# Patient Record
Sex: Male | Born: 2000 | Race: Black or African American | Hispanic: No | Marital: Single | Smoking: Never smoker
Health system: Southern US, Community
[De-identification: ages and names within clinical notes are randomized; demographics above are authoritative.]

## PROBLEM LIST (undated history)

## (undated) ENCOUNTER — Emergency Department (HOSPITAL_COMMUNITY): Admission: EM | Payer: Medicaid Other | Source: Home / Self Care

## (undated) DIAGNOSIS — F319 Bipolar disorder, unspecified: Secondary | ICD-10-CM

## (undated) DIAGNOSIS — R011 Cardiac murmur, unspecified: Secondary | ICD-10-CM

## (undated) DIAGNOSIS — F909 Attention-deficit hyperactivity disorder, unspecified type: Secondary | ICD-10-CM

## (undated) HISTORY — PX: NO PAST SURGERIES: SHX2092

---
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
## 2018-01-14 ENCOUNTER — Emergency Department (HOSPITAL_COMMUNITY): Payer: Medicaid Other

## 2018-01-14 ENCOUNTER — Emergency Department (HOSPITAL_COMMUNITY)
Admission: EM | Admit: 2018-01-14 | Discharge: 2018-01-14 | Disposition: A | Discharge status: Home / Self Care | Payer: Medicaid Other | Attending: Emergency Medicine | Admitting: Emergency Medicine

## 2018-01-14 ENCOUNTER — Encounter (HOSPITAL_COMMUNITY): Payer: Self-pay | Admitting: Emergency Medicine

## 2018-01-14 DIAGNOSIS — S43102A Unspecified dislocation of left acromioclavicular joint, initial encounter: Secondary | ICD-10-CM | POA: Diagnosis not present

## 2018-01-14 DIAGNOSIS — X500XXA Overexertion from strenuous movement or load, initial encounter: Secondary | ICD-10-CM | POA: Diagnosis not present

## 2018-01-14 DIAGNOSIS — S4992XA Unspecified injury of left shoulder and upper arm, initial encounter: Secondary | ICD-10-CM | POA: Diagnosis present

## 2018-01-14 DIAGNOSIS — Y929 Unspecified place or not applicable: Secondary | ICD-10-CM | POA: Diagnosis not present

## 2018-01-14 DIAGNOSIS — Y9389 Activity, other specified: Secondary | ICD-10-CM | POA: Diagnosis not present

## 2018-01-14 DIAGNOSIS — Y999 Unspecified external cause status: Secondary | ICD-10-CM | POA: Insufficient documentation

## 2018-01-14 MED ORDER — OXYCODONE-ACETAMINOPHEN 5-325 MG PO TABS: 1.0000 | ORAL_TABLET | Freq: Four times a day (QID) | ORAL | 0 refills | Status: DC | PRN

## 2018-01-14 MED ORDER — FENTANYL CITRATE (PF) 100 MCG/2ML IJ SOLN
50.0000 ug | Freq: Once | INTRAMUSCULAR | Status: AC
Start: 2018-01-14 — End: 2018-01-14
  Administered 2018-01-14: 50 ug via INTRAVENOUS
  Filled 2018-01-14: qty 2

## 2018-01-14 MED ORDER — IBUPROFEN 800 MG PO TABS: 800.0000 mg | ORAL_TABLET | Freq: Three times a day (TID) | ORAL | 0 refills | Status: DC

## 2018-01-14 NOTE — ED Provider Notes (Signed)
Emergency Department Provider Note   I have reviewed the triage vital signs and the nursing notes.   HISTORY  Chief Complaint Shoulder Injury   HPI Stephen Byrd is a 17 y.o. male with a history of left shoulder spontaneous dislocation and reductions also AC separation the presents the emergency department today with left shoulder deformity.  Patient states he was reaching back to grab a box when he felt a pop in his left shoulder and had instant deformity and pain and decreased range of motion without left shoulder.  Similar to previous episodes but it did not really return to normal like it normally does.  No falls.  No other associated symptoms.  No pain elsewhere. No other associated or modifying symptoms.    Past Medical History:  Diagnosis Date  . ADHD (attention deficit hyperactivity disorder)   . Bipolar 1 disorder (HCC)   . Heart murmur     There are no active problems to display for this patient.   History reviewed. No pertinent surgical history.  Current Outpatient Rx  . Order #: [id number] Class: Print  . Order #: [id number] Class: Print    Allergies Patient has no known allergies.  History reviewed. No pertinent family history.  Social History Social History   Tobacco Use  . Smoking status: Never Smoker  . Smokeless tobacco: Never Used  Substance Use Topics  . Alcohol use: No  . Drug use: Yes    Frequency: 2.0 times per week    Types: Marijuana    Review of Systems  All other systems negative except as documented in the HPI. All pertinent positives and negatives as reviewed in the HPI. ____________________________________________   PHYSICAL EXAM:  VITAL SIGNS: ED Triage Vitals [01/14/18 1551]  Enc Vitals Group     BP 135/66     Pulse Rate 74     Resp 16     Temp 98.7 F (37.1 C)     Temp Source Oral     SpO2 100 %     Weight 150 lb (68 kg)     Height 5\' 6"  (1.676 m)    Constitutional: Alert and oriented. Well appearing and in no  acute distress. Eyes: Conjunctivae are normal. PERRL. EOMI. Head: Atraumatic. Nose: No congestion/rhinnorhea. Mouth/Throat: Mucous membranes are moist.  Oropharynx non-erythematous. Neck: No stridor.  No meningeal signs.   Cardiovascular: Normal rate, regular rhythm. Good peripheral circulation. Grossly normal heart sounds.   Respiratory: Normal respiratory effort.  No retractions. Lungs CTAB. Gastrointestinal: Soft and nontender. No distention.  Musculoskeletal: No lower extremity tenderness nor edema. No gross deformities of extremities. Left shoulder with obvious protuberance in area of AC joint. Neurologic:  Normal speech and language. No gross focal neurologic deficits are appreciated.  Skin:  Skin is warm, dry and intact. No rash noted.  ____________________________________________  RADIOLOGY  Dg Shoulder Left  Result Date: 01/14/2018 CLINICAL DATA:  Patient with left shoulder pain. EXAM: LEFT SHOULDER - 2+ VIEW COMPARISON:  Left shoulder radiograph 07/03/2017 FINDINGS: No acute fracture or dislocation of the glenohumeral joint. Widened appearance of the [facility] with elevation of the distal clavicle. Left hemithorax unremarkable. IMPRESSION: Findings suggestive of AC joint separation. No acute abnormality of the glenohumeral joint. Electronically Signed   By: Annia Beltrew  Davis M.D.   On: 01/14/2018 16:45    ____________________________________________   INITIAL IMPRESSION / ASSESSMENT AND PLAN / ED COURSE  Suspect total AC separation rather than humeral head dislocation. Will xr to confirm. Pain meds.  X-ray consistent with likely type III AC separation.  Put in sling.  We will follow-up with orthopedics this time.  Short course of pain medications given.  Work note to not work for at least 2 weeks or until cleared by orthopedics.   Pertinent labs & imaging results that were available during my care of the patient were reviewed by me and considered in my medical decision making (see  chart for details).  ____________________________________________  FINAL CLINICAL IMPRESSION(S) / ED DIAGNOSES  Final diagnoses:  Separation of left acromioclavicular joint, initial encounter     MEDICATIONS GIVEN DURING THIS VISIT:  Medications  fentaNYL (SUBLIMAZE) injection 50 mcg (50 mcg Intravenous Given 01/14/18 1604)     NEW OUTPATIENT MEDICATIONS STARTED DURING THIS VISIT:  Discharge Medication List as of 01/14/2018  4:50 PM    START taking these medications   Details  ibuprofen (ADVIL,MOTRIN) 800 MG tablet Take 1 tablet (800 mg total) by mouth 3 (three) times daily., Starting Sun 01/14/2018, Print    oxyCODONE-acetaminophen (PERCOCET) 5-325 MG tablet Take 1 tablet by mouth every 6 (six) hours as needed for severe pain., Starting Sun 01/14/2018, Print        Note:  This note was prepared with assistance of Dragon voice recognition software. Occasional wrong-word or sound-a-like substitutions may have occurred due to the inherent limitations of voice recognition software.   Marily Memos, MD 01/14/18 2046

## 2018-01-14 NOTE — ED Triage Notes (Signed)
Pt reports left shoulder dislocation while moving boxes at work.  Deformity to shoulder noted.  3rd time dislocated.

## 2018-01-15 ENCOUNTER — Telehealth: Payer: Self-pay | Admitting: Orthopedic Surgery

## 2018-01-15 NOTE — Telephone Encounter (Signed)
Patient/mom called - aware must speak with patient as 17 years of age, regarding Emergency [facility] visit [facility] for left [facility] injury (?separation) - states thinks has [employer] insurance; appointment pending, as if Medicaid, primary care referral needed per insurance requirement.  Patient's ph# [phone]

## 2018-01-16 NOTE — Telephone Encounter (Signed)
Referral received; called patient to schedule appointment.

## 2018-01-16 NOTE — Telephone Encounter (Signed)
Patient came by office; discussed pending appointment - patient and mom aware that referral is required per insurance. Contacting [facility] to request appointment for as soon as possible based on Emergency room reports. We have also sent faxed request.

## 2018-01-17 ENCOUNTER — Encounter: Payer: Self-pay | Admitting: Orthopedic Surgery

## 2018-01-17 ENCOUNTER — Ambulatory Visit (INDEPENDENT_AMBULATORY_CARE_PROVIDER_SITE_OTHER): Payer: Medicaid Other | Admitting: Orthopedic Surgery

## 2018-01-17 VITALS — BP 107/63 | HR 44 | Ht 67.0 in | Wt 152.0 lb

## 2018-01-17 DIAGNOSIS — S43102A Unspecified dislocation of left acromioclavicular joint, initial encounter: Secondary | ICD-10-CM | POA: Diagnosis not present

## 2018-01-17 MED ORDER — HYDROCODONE-ACETAMINOPHEN 5-325 MG PO TABS: 1.0000 | ORAL_TABLET | Freq: Four times a day (QID) | ORAL | 0 refills | Status: DC | PRN

## 2018-01-17 NOTE — Progress Notes (Signed)
NEW PATIENT OFFICE VISI  Chief Complaint  Patient presents with  . Shoulder Injury    Left shoulder multiple injuries over past year    17 year old male injured his left shoulder traumatic injury 1 year ago sustained an AC joint injury.  He was treated with a sling after ER visit at [facility] in [place].  He says after that injury or during that injury he developed prominence of his distal clavicle.  He subsequently recovered although he always noticed that he could get his arm in certain positions.  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  He took some oxycodone from the hospital which did help his pain and he describes a dull ache but no significant fatigue noticed over the last year.   Review of Systems  Constitutional: Negative for fever.  Musculoskeletal: Negative for neck pain.  Skin: Negative.   Neurological: Negative for tingling and weakness.     Past Medical History:  Diagnosis Date  . ADHD (attention deficit hyperactivity disorder)   . Bipolar 1 disorder (HCC)   . Heart murmur     No past surgical history on file.  Family History  Problem Relation Age of Onset  . Hypertension Mother   . Hypertension Maternal Grandfather    Social History   Tobacco Use  . Smoking status: Never Smoker  . Smokeless tobacco: Never Used  Substance Use Topics  . Alcohol use: No  . Drug use: Yes    Frequency: 2.0 times per week    Types: Marijuana    No Known Allergies  Current Meds  Medication Sig  . ibuprofen (ADVIL,MOTRIN) 800 MG tablet Take 1 tablet (800 mg total) by mouth 3 (three) times daily.  Marland Kitchen. oxyCODONE-acetaminophen (PERCOCET) 5-325 MG tablet Take 1 tablet by mouth every 6 (six) hours as needed for severe pain.    BP 107/63   Pulse (!) 44    Ht 5\' 7"  (1.702 m)   Wt 152 lb (68.9 kg)   BMI 23.81 kg/m   Physical Exam  Constitutional: He is oriented to person, place, and time. He appears well-developed and well-nourished.  Vital signs have been reviewed and are stable. Gen. appearance the patient is well-developed and well-nourished with normal grooming and hygiene.   Neurological: He is alert and oriented to person, place, and time.  Skin: Skin is warm and dry. No erythema.  Psychiatric: He has a normal mood and affect.  Vitals reviewed.   Right Shoulder Exam  Right shoulder exam is normal.  Tenderness  The patient is experiencing no tenderness.  Range of Motion  The patient has normal right shoulder ROM.  Muscle Strength  The patient has normal right shoulder strength.  Tests  Apprehension: negative  Other  Erythema: absent Sensation: normal Pulse: present   Left Shoulder Exam   Tests  Apprehension: negative  Other  Erythema: absent Sensation: normal Pulse: present   Comments:  Most notable is the tenderness and prominence of the left distal clavicle.  Passive range of motion is normal up to 90 degrees flexion and abduction and then after that he has pain at the [facility] worsened with a cross chest compression test.  With the arm at his side is internal/external rotation is normal and cuff strength is  normal in this position he has weakness in abduction and flexion the abduction external rotation test showed a stable glenohumeral joint the skin was intact the pulses were good the lymph nodes were normal and he had normal sensation in his left hand        MEDICAL DECISION SECTION  Xrays were done at [facility].  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  . 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  Any further prescriptions can be ibuprofen until he has surgery if he does have surgery  Meds ordered this encounter  Medications  . HYDROcodone-acetaminophen (NORCO/VICODIN) 5-325 MG tablet    Sig: Take 1 tablet by mouth every 6 (six) hours as needed for moderate pain.    Dispense:  30 tablet    Refill:  0    Fuller Canada, MD  01/17/2018 4:06 PM

## 2018-01-17 NOTE — Patient Instructions (Addendum)
Out of work 12 weeks  Referral to Dr. August Saucer for surgical consultation left shoulder   Acromioclavicular Separation Acromioclavicular separation is an injury to the small joint at the top of the shoulder (acromioclavicular joint or AC joint). The [facility] joint connects the outer tip of the collarbone (clavicle) to the top of the shoulder blade (acromion). Two strong cords of tissue (acromioclavicular ligament and coracoclavicular ligament) stretch across the [facility] joint to keep it in place. An AC joint separation happens when one or both ligaments stretch or tear, causing the joint to separate. There are six types of separation. The type of separation that you have depends on how much the ligaments are damaged and how far the joint has moved out of place. The less severe types of separation are most common. What are the causes? Common causes of this condition include:  A hard, direct hit (blow) to the top of the shoulder.  Falling on the shoulder.  Falling on an outstretched arm.  What increases the risk? This condition is more likely to develop in male athletes under age 43 who participate in sports that involve potential contact, such as:  Football.  Rugby.  Hockey.  Cycling.  Martial arts.  What are the signs or symptoms?  The main symptom of this condition is shoulder pain. Pain may be mild or severe, depending on the type of separation. Other signs and symptoms in the shoulder may include:  Swelling.  Limited range of motion, especially when moving the arm across the body.  Pain and tenderness when touching the top of the shoulder.  Pain when putting weight on the shoulder, such as rolling on the shoulder while sleeping.  A visible bump (deformity) over the joint.  A clicking or popping sound when moving the shoulder.  How is this diagnosed? This condition may be diagnosed based on:  Your symptoms.  Your medical history, including your history of recent injuries.  A  physical exam to check for deformity and limited range of motion.  Imaging tests, such as: ? X-rays. ? MRI. ? Ultrasound.  How is this treated? Treatment for this condition may include:  Resting the shoulder before gradually returning to normal activities.  Icing the shoulder.  NSAIDs to help reduce pain and swelling.  A sling to support your shoulder and keep it from moving.  Physical therapy.  Surgery. This is rare. Surgery may be needed for severe injuries that include breaks (fractures) in a bone, or injuries that do not get better with nonsurgical treatments. Surgery is followed by keeping your joint in place for a period of time (immobilization) and physical therapy.  Follow these instructions at home: If you have a sling:  Wear the sling as told by your health care provider. Remove it only as told by your health care provider.  Reposition the sling if your fingers tingle, become numb, or turn cold and blue.  Do not let your sling get wet if it is not waterproof. Ask your health care provider if you can remove the sling for bathing and showering.  Keep the sling clean. Managing pain, stiffness, and swelling   If directed, apply ice to the injured area. ? Put ice in a plastic bag. ? Place a towel between your skin and the bag. ? Leave the ice on for 20 minutes, 2-3 times a day.  Move your fingers often to avoid stiffness and to lessen swelling. Driving  Do not drive or operate heavy machinery while taking prescription pain  medicine.  Ask your health care provider when it is safe for you to drive. Activity  Rest and return to your normal activities as told by your health care provider. Ask your health care provider what activities are safe for you.  Do exercises as told by your health care provider. General instructions  Do not use any tobacco products, such as cigarettes, chewing tobacco, and e-cigarettes. Tobacco can delay healing. If you need help quitting,  ask your health care provider.  Take over-the-counter and prescription medicines only as told by your health care provider.  Keep all follow-up visits as told by your health care provider. This is important. How is this prevented?  Make sure to use equipment that fits you.  Wear shoulder padding during contact sports.  Be safe and responsible while being active to avoid falls. Contact a health care provider if:  Your pain and stiffness do not improve after 2 weeks. This information is not intended to replace advice given to you by your health care provider. Make sure you discuss any questions you have with your health care provider. Document Released: 04/18/2005 Document Revised: 12/24/2015 Document Reviewed: 03/21/2015 Elsevier Interactive Patient Education  [employer]2018 Elsevier Inc.

## 2018-01-31 ENCOUNTER — Ambulatory Visit (INDEPENDENT_AMBULATORY_CARE_PROVIDER_SITE_OTHER): Payer: Medicaid Other | Admitting: Orthopedic Surgery

## 2018-02-08 ENCOUNTER — Encounter (INDEPENDENT_AMBULATORY_CARE_PROVIDER_SITE_OTHER): Payer: Self-pay | Admitting: Orthopedic Surgery

## 2018-02-08 ENCOUNTER — Ambulatory Visit (INDEPENDENT_AMBULATORY_CARE_PROVIDER_SITE_OTHER): Payer: Medicaid Other

## 2018-02-08 ENCOUNTER — Other Ambulatory Visit (INDEPENDENT_AMBULATORY_CARE_PROVIDER_SITE_OTHER): Payer: Self-pay | Admitting: Orthopedic Surgery

## 2018-02-08 ENCOUNTER — Ambulatory Visit (INDEPENDENT_AMBULATORY_CARE_PROVIDER_SITE_OTHER): Payer: Medicaid Other | Admitting: Orthopedic Surgery

## 2018-02-08 DIAGNOSIS — M25512 Pain in left shoulder: Secondary | ICD-10-CM

## 2018-02-08 DIAGNOSIS — S43102A Unspecified dislocation of left acromioclavicular joint, initial encounter: Secondary | ICD-10-CM

## 2018-02-08 NOTE — Progress Notes (Signed)
Office Visit Note   Patient: Stephen Byrd           Date of Birth: December 28, 2000           MRN: [id number] Visit Date: 02/08/2018 Requested by: Stephen Hearing, MD [address] [place], [place] [id number] PCP: Stephen Byrd., PA-C  Subjective: Chief Complaint  Patient presents with  . Left Shoulder - Pain    HPI: Stephen Byrd is a patient with left shoulder pain.  Injured his shoulder 2 years ago.  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  Takes ibuprofen for pain.  He is right-hand dominant.  At times he reports decreased range of motion.  He injured his shoulder playing with his son 2 years ago while [place] was [place] the park.  No family history of personal history of DVT or pulmonary embolism              ROS: All systems reviewed are negative as they relate to the chief complaint within the history of present illness.  Patient denies  fevers or chills.   Assessment & Plan: Visit Diagnoses:  1. Arthralgia of left acromioclavicular joint   2. AC joint dislocation, left, initial encounter     Plan: Impression is chronic AC separation grade 5 with some limitation of overhead motion in a patient who is currently doing a lot of physical type work.  I discussed operative and nonoperative treatment options with Stephen Byrd.  I think in general he would do better with distal clavicle excision in coracoclavicular ligament stabilization.  Would likely transfer some of that CA ligament into the distal end of that clavicle for added biologic fixation.  Risk and benefits of surgical intervention including but not limited to incomplete pain relief incomplete restoration of normal anatomy as well as difficulty with overhead lifting described.  All questions answered.  Anticipate that he would be out of heavy duty lifting for at least 3 months.  Conceivably he could go back to doing some light duty type work not involving lifting with the right arm after  about a month.  All questions answered  Follow-Up Instructions: No follow-ups on file.   Orders:  Orders Placed This Encounter  Procedures  . XR AC Joints   No orders of the defined types were placed in this encounter.     Procedures: No procedures performed   Clinical Data: No additional findings.  Objective: Vital Signs: There were no vitals taken for this visit.  Physical Exam:   Constitutional: Patient appears well-developed HEENT:  Head: Normocephalic Eyes:EOM are normal Neck: Normal range of motion Cardiovascular: Normal rate Pulmonary/chest: Effort normal Neurologic: Patient is alert Skin: Skin is warm Psychiatric: Patient has normal mood and affect    Ortho Exam: Ortho exam demonstrates intact motor or sensory function to the left arm.  There is significant prominence of the distal end of the clavicle left versus right.  Patient does have tenderness to palpation around that [facility] joint on the left as well as painful and somewhat limited forward flexion and abduction past 90 degrees.  Cuff strength is intact and O'Brien's testing negative on the left.  Right shoulder examination is normal  Specialty Comments:  No specialty comments available.  Imaging: Xr Ac Joints  Result Date: 02/08/2018 Weighted [place]ck view of bilateral AC joints demonstrates increased coracoclavicular distance of 22 mm on the left compared to 11 mm on the right  PMFS History: There are no active problems to display for this patient.  Past Medical History:  Diagnosis Date  . ADHD (attention deficit hyperactivity disorder)   . Bipolar 1 disorder (HCC)   . Heart murmur     Family History  Problem Relation Age of Onset  . Hypertension Mother   . Hypertension Maternal Grandfather     History reviewed. No pertinent surgical history. Social History   Occupational History  . Not on file  Tobacco Use  . Smoking status: Never Smoker  . Smokeless tobacco: Never Used  Substance  and Sexual Activity  . Alcohol use: No  . Drug use: Yes    Frequency: 2.0 times per week    Types: Marijuana  . Sexual activity: Not on file

## 2018-02-12 ENCOUNTER — Encounter (HOSPITAL_COMMUNITY): Payer: Self-pay | Admitting: *Deleted

## 2018-02-12 NOTE — Anesthesia Preprocedure Evaluation (Addendum)
Anesthesia Evaluation  Patient identified by MRN, date of birth, ID band Patient awake    Reviewed: Allergy & Precautions, H&P , NPO status , Patient's Chart, lab work & pertinent test results  Airway Mallampati: II  TM Distance: >3 FB Neck ROM: Full    Dental no notable dental hx. (+) Teeth Intact, Dental Advisory Given   Pulmonary    Pulmonary exam normal breath sounds clear to auscultation       Cardiovascular negative cardio ROS Normal cardiovascular exam Rhythm:Regular Rate:Normal     Neuro/Psych Bipolar Disorder    GI/Hepatic negative GI ROS,   Endo/Other    Renal/GU      Musculoskeletal negative musculoskeletal ROS (+)   Abdominal   Peds  Hematology   Anesthesia Other Findings   Reproductive/Obstetrics                            Anesthesia Physical Anesthesia Plan  ASA: II  Anesthesia Plan: General   Post-op Pain Management:  Regional for Post-op pain   Induction: Intravenous  PONV Risk Score and Plan: 2 and Treatment may vary due to age or medical condition, Ondansetron and Dexamethasone  Airway Management Planned: Oral ETT  Additional Equipment:   Intra-op Plan:   Post-operative Plan: Extubation in OR  Informed Consent: I have reviewed the patients History and Physical, chart, labs and discussed the procedure including the risks, benefits and alternatives for the proposed anesthesia with the patient or authorized representative who has indicated his/her understanding and acceptance.   Dental advisory given  Plan Discussed with: CRNA  Anesthesia Plan Comments:        Anesthesia Quick Evaluation

## 2018-02-12 NOTE — Progress Notes (Signed)
Denies chest pain, shob, or syncopal episodes. Advised that he had not seen cardiology since 2016. Verbalized understanding of instructions. Requested that patient stop marijuana.

## 2018-02-12 NOTE — Progress Notes (Signed)
Anesthesia Chart Review: SAME DAY WORK-UP  Case:  [id number] Date/Time:  02/13/18 1446   Procedure:  LEFT SHOULDER ACROMIOCLAVICULAR RECONSTRUCTION (Left )   Anesthesia type:  General   Pre-op diagnosis:  left shoulder acromioclavicular separation   Location:  [facility] OR ROOM 06 / [facility] OR   Surgeon:  Cammy Copa, MD      DISCUSSION: Patient is a 17 year old male schedule for the above procedure. He had ED visit 07/03/17 for left shoulder pain with AC joint separation.  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   History includes never smoker, Bipolar 1 disorder, ADHD, murmur (trace TR/MR '16). He underwent extensive cardiac testing 2016 for recurrent syncope with work-up showing, "only physiologic adaptation of the heart to athletic conditioning without evidence of significant underlying cardiac pathology, including no evidence of: hypertrophic cardiomyopathy, anomalous origin of the coronary arteries, myocarditis, ARVC, inherited arrhythmia syndrome (long QT, CPVT, Brugada), or WPW. It may be that he has some degree of dysautonomia that has presented in a less typical manner." No activity restrictions recommended--see below for more details and testing results.   He was evaluated by cardiologist Eber Hong, MD in 2016 for evaluation of recurrent syncope with an episode of chest pain (see [facility] Everywhere). According to notes, patient had a paternal uncle who died in his 61's for presumed MI, but no known family history of "congenital heart disease, sudden or unexplained death/drowning/near-drowning, Wolff-Parkinson-White (WPW), long QT, short QT, catecholaminergic polymorphic ventricular tachycardia (CPVT), cardiomyopathy including hypertrophic cardiomyopathy, arrhythmia, or early need for a pacemaker. No family history of seizure disorder." Work-up includes event and Holter monitor, echo, stress echo, and cardiac MRI. According to last office visit 05/01/15:    "He has had the following testing since our last visit: 1. Holter monitor: placed 01/30/15- avg HR 57 bpm (mildly bradycardic, consistent with athletic conditioning), range 37-132 bpm. No SVE. One VE. No heart block or significant arrhythmias.  2. Event monitor: 3 transmissions, none symptomatic. No significant arrhythmias. Sinus bradycardia was noted.  3. Exercise test with stress echocardiogram: normal stress echocardiogram. Normal exercise test.  4. Cardiac MRI: upper normal to mildly dilated right and left ventricles. Normal RV function. Low normal LV function. Likely physiologic adaptation to athletic conditioning. Normal origins of both coronary arteries. No evidence of scarring or regional wall motion abnormalities. No evidence of ARVC.... Impressions:   Stephen Byrd has now had 7-8 episodes of syncope, most at rest, some without recollection of prodrome though some with more typical prodrome. He has had extensive cardiac work-up that has shown only physiologic adaptation of the heart to athletic conditioning without evidence of significant underlying cardiac pathology, including no evidence of: hypertrophic cardiomyopathy, anomalous origin of the coronary arteries, myocarditis, ARVC, inherited arrhythmia syndrome (long QT, CPVT, Brugada), or WPW. As such, I see no clear reason to continue to recommend sports/activity restrictions from a cardiac perspective. It may be that he has some degree of dysautonomia that has presented in a less typical manner. I thus counseled him on lifestyle management to decrease the frequency of symptoms of pre-syncope/syncope." Six month follow-up planned, but no further activity limitations and no SBE prophylaxis recommended.   ED visit 05/18/17 for feeling weak after not eating much. Father was with patient at that visit who said it was not for syncope. Patient denied any recurrent syncope episode since his last cardiology visit 04/2015 and has a physically active job.  ClaBased on currently available information,  I would anticipate that he can proceed as planned. Anesthesiologist to evaluate on the day of surgery. He has known early repolarization pattern on his EKG.     PROVIDERS: Tylene Fantasia., PA-C Eber Hong, MD is pediatric cardiologist. Last visit 05/01/15 [facility] Everywhere)   LABS: He is for updated labs on the day of surgery.   IMAGES: DG Shoulder Left 01/14/18: IMPRESSION: Findings suggestive of AC joint separation. No acute abnormality of the glenohumeral joint.   EKG: 05/18/17: SB at 51 bpm. ST elevation, probably early repolarization pattern. EKG appears stable when compared to 04/09/15 tracing.     CV: Stress echo 03/24/15 [facility] Everywhere): Negative stress test with no obstruction or wall motion abnormalities noted with a peak heart rate of 184 bpm. CARDIAC POSITION  Levocardia. D Ventricular Loop. S Normal position great vessels. ATRIA  Normal right atrial size. Normal left atrial size. Intact atrial septum. ATRIOVENTRICULAR VALVES  Normal tricuspid valve. Normal mitral valve. Normal mitral valve inflow velocity. VENTRICLES  Normal right ventricle structure and size. Normal left ventricle structure and size. CARDIAC FUNCTION  Normal right ventricular systolic function. Normal left ventricular systolic function. SEMILUNAR VALVES  Normal pulmonic valve. Aortic valve mobility appears normal. OTHER PHYSIOLOGY  Normal sinus rhythm. EXTRACARDIAC  No pericardial effusion.  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 - Normal systemic and pulmonary venous connections. - Normal right and left atrial size.  The atrial septum appears intact.  A patent foramen ovale cannot be excluded. - Normal tricuspid valve with mild regurgitation and no stenosis. - Normal mitral valve with no  stenosis or regurgitation. - Mild right ventricular dilation with normal wall thickness and systolic function.  The calculated RV ejection fraction is 56%.  There are no regional wall motion abnormalities or regions of abnormal dilation. - Right ventricular volumes, both absolute and indexed to body surface area, with gender-matched normal values (boys, ages 8-17 years, mean +/- SD, range) listed in parentheses, are: EDV: 194 ml ESV: 85 ml EDVI: 111 ml/m2 (86 +/- 12, 61-108) ESVI: 49 ml/m2 (32 +/- 7, 21-47) - Mild left ventricular dilation with normal wall-thickness and low-normal systolic function.  The calculated LV ejection fraction is 58%.  There are no regional wall motion abnormalities. - Left ventricular volumes, both absolute and indexed to body surface area, with gender-matched normal values (boys, ages 8-17 years, mean +/- SD, range) listed in parentheses, are: EDV: 187 ml  ESV: 78 ml EDVI: 107 ml/m2 (79 +/- 11, 64-102) ESVI: 45 ml/m2 (25 +/- 6, 16-39) - Intact ventricular septum. - No right or left ventricular outflow tract obstruction. - Normal pulmonary valve with no stenosis or significant regurgitation. - Normal trileaflet aortic valve with no stenosis or significant regurgitation. - Normal origins and proximal courses of the right and left coronary arteries. - Normal main and branch pulmonary arteries. - Left aortic arch.  Branching is not demonstrated.  No evidence of coarctation or patent ductus arteriosus. - Trivial anterior pericardial effusion. - Delayed enhancement imaging is normal.There is no MRI evidence of intracardiac thrombus, myocardial scar or infiltrative process.  Echo 01/30/15 ([facility] Everywhere): INTERPRETATION SUMMARY   No cardiac disease identified. CARDIAC POSITION   Levocardia. Abdominal situs solitus. Atrial situs solitus. D Ventricular Loop. S Normal position great vessels. VEINS   Normal systemic venous connections. One right and one left sided  pulmonary vein are confirmed draining normally to  the left atrium. ATRIA   Normal right atrial size. Normal left atrial size. The atrial septum is not well seen in this study, cannot rule out small defects or a patent foramen ovale. ATRIOVENTRICULAR VALVES   Normal tricuspid valve. Normal tricuspid valve inflow velocity. Trace tricuspid valve regurgitation. Normal mitral valve. Normal mitral valve inflow velocity. Trace mitral valve regurgitation. VENTRICLES   Normal right ventricle structure and size. Normal left ventricle structure and size. Intact ventricular septum. CARDIAC FUNCTION   Normal right ventricular systolic function. Normal left ventricular systolic function. SEMILUNAR VALVES   Normal pulmonic valve. No pulmonary valve stenosis. Trivial pulmonary valve insufficiency. Tricommissural aortic valve. No aortic valve stenosis by Doppler. No aortic valve insufficiency by color Doppler. CORONARY ARTERIES   Normal origin and proximal course of the right coronary artery with prograde flow demonstrated by color Doppler. Normal origin and proximal course of the left coronary artery with prograde flow demonstrated by color Doppler. GREAT ARTERIES   Left aortic arch with normal branching pattern. No evidence of coarctation of the aorta. There is normal pulsatility of the abdominal aorta. Normal main pulmonary artery and pulmonary artery branches. SHUNTS   No patent ductus arteriosus. OTHER PHYSIOLOGY   Right ventricle systolic pressure estimate normal. EXTRACARDIAC   No pericardial effusion. There is no pleural effusion. Garrel Ridgel Classic Z-Scores Measurement Name   Value              Z-Score   Predicted    Normal Range Ao root diam       2.7 cm             0.11              2.7                2.1 - 3.2 IVSd                0.77 cm           -0.20             0.80               0.49 - 1.12 IVSs                1.1 cm             -0.14             1.1                0.72 - 1.54 LVIDd                5.5 cm             1.4               4.9                4.0 - 5.7 LVIDs               3.8 cm             2.0               3.0                2.2 - 3.8 LVPWd              0.63 cm           -0.99             0.75  0.50 - 0.99 LVPWs              1.3 cm            -0.55             1.4                1.00 - 1.71  Cardiac monitoring (as outlined by Dr. Mindi Junker, as full report can not be viewed in [facility] Everywhere): 1. Holter monitor: placed 01/30/15- avg HR 57 bpm (mildly bradycardic, consistent with athletic conditioning), range 37-132 bpm. No SVE. One VE. No heart block or significant arrhythmias.  2. Event monitor: 3 transmissions, none symptomatic. No significant arrhythmias. Sinus bradycardia was noted.    Past Medical History:  Diagnosis Date  . ADHD (attention deficit hyperactivity disorder)   . Bipolar 1 disorder (HCC)   . Heart murmur     Past Surgical History:  Procedure Laterality Date  . NO PAST SURGERIES      MEDICATIONS: No current facility-administered medications for this encounter.    Marland Kitchen HYDROcodone-acetaminophen (NORCO/VICODIN) 5-325 MG tablet  . ibuprofen (ADVIL,MOTRIN) 800 MG tablet  . oxyCODONE-acetaminophen (PERCOCET) 5-325 MG tablet    [facility] Short Stay Center/Anesthesiology Phone [phone] 02/12/2018 1:18 PM

## 2018-02-13 ENCOUNTER — Encounter (HOSPITAL_COMMUNITY)
Admission: RE | Disposition: A | Discharge status: Home / Self Care | Payer: Self-pay | Source: Ambulatory Visit | Attending: Orthopedic Surgery

## 2018-02-13 ENCOUNTER — Ambulatory Visit (HOSPITAL_COMMUNITY): Payer: Medicaid Other | Admitting: Vascular Surgery

## 2018-02-13 ENCOUNTER — Ambulatory Visit (HOSPITAL_COMMUNITY)
Admission: RE | Admit: 2018-02-13 | Discharge: 2018-02-13 | Disposition: A | Discharge status: Home / Self Care | Payer: Medicaid Other | Source: Ambulatory Visit | Attending: Orthopedic Surgery | Admitting: Orthopedic Surgery

## 2018-02-13 ENCOUNTER — Encounter (HOSPITAL_COMMUNITY): Payer: Self-pay | Admitting: Certified Registered Nurse Anesthetist

## 2018-02-13 ENCOUNTER — Ambulatory Visit (HOSPITAL_COMMUNITY): Payer: Medicaid Other

## 2018-02-13 DIAGNOSIS — M25512 Pain in left shoulder: Secondary | ICD-10-CM | POA: Diagnosis present

## 2018-02-13 DIAGNOSIS — S43102A Unspecified dislocation of left acromioclavicular joint, initial encounter: Secondary | ICD-10-CM | POA: Insufficient documentation

## 2018-02-13 DIAGNOSIS — Z419 Encounter for procedure for purposes other than remedying health state, unspecified: Secondary | ICD-10-CM

## 2018-02-13 DIAGNOSIS — S4382XA Sprain of other specified parts of left shoulder girdle, initial encounter: Secondary | ICD-10-CM | POA: Insufficient documentation

## 2018-02-13 DIAGNOSIS — W19XXXA Unspecified fall, initial encounter: Secondary | ICD-10-CM | POA: Insufficient documentation

## 2018-02-13 HISTORY — PX: ACROMIO-CLAVICULAR JOINT REPAIR: SHX5183

## 2018-02-13 LAB — CBC
HEMATOCRIT: 41.9 % (ref 39.0–52.0)
Hemoglobin: 13.2 g/dL (ref 13.0–17.0)
MCH: 27 pg (ref 26.0–34.0)
MCHC: 31.5 g/dL (ref 30.0–36.0)
MCV: 85.7 fL (ref 80.0–100.0)
Platelets: 205 10*3/uL (ref 150–400)
RBC: 4.89 MIL/uL (ref 4.22–5.81)
RDW: 13.2 % (ref 11.5–15.5)
WBC: 4.6 10*3/uL (ref 4.0–10.5)
nRBC: 0 % (ref 0.0–0.2)

## 2018-02-13 SURGERY — REPAIR, ACROMIOCLAVICULAR JOINT
Anesthesia: General | Laterality: Left

## 2018-02-13 MED ORDER — CLONIDINE HCL 0.2 MG PO TABS
ORAL_TABLET | ORAL | Status: AC
  Filled 2018-02-13: qty 1

## 2018-02-13 MED ORDER — FENTANYL CITRATE (PF) 100 MCG/2ML IJ SOLN
100.0000 ug | Freq: Once | INTRAMUSCULAR | Status: AC
  Administered 2018-02-13: 100 ug via INTRAVENOUS

## 2018-02-13 MED ORDER — ACETAMINOPHEN 10 MG/ML IV SOLN: 1000.0000 mg | Freq: Once | INTRAVENOUS | Status: DC | PRN

## 2018-02-13 MED ORDER — GABAPENTIN 300 MG PO CAPS
300.0000 mg | ORAL_CAPSULE | Freq: Once | ORAL | Status: AC
  Administered 2018-02-13: 300 mg via ORAL

## 2018-02-13 MED ORDER — MIDAZOLAM HCL 5 MG/5ML IJ SOLN
INTRAMUSCULAR | Status: DC | PRN
  Administered 2018-02-13: 2 mg via INTRAVENOUS

## 2018-02-13 MED ORDER — DEXAMETHASONE SODIUM PHOSPHATE 4 MG/ML IJ SOLN
INTRAMUSCULAR | Status: DC | PRN
  Administered 2018-02-13: 10 mg via INTRAVENOUS

## 2018-02-13 MED ORDER — CLONIDINE HCL (ANALGESIA) 100 MCG/ML EP SOLN
EPIDURAL | Status: DC | PRN
  Administered 2018-02-13: 50 ug

## 2018-02-13 MED ORDER — MIDAZOLAM HCL 2 MG/2ML IJ SOLN
2.0000 mg | Freq: Once | INTRAMUSCULAR | Status: AC
  Administered 2018-02-13: 2 mg via INTRAVENOUS

## 2018-02-13 MED ORDER — PROPOFOL 10 MG/ML IV BOLUS
INTRAVENOUS | Status: DC | PRN
  Administered 2018-02-13: 180 mg via INTRAVENOUS
  Administered 2018-02-13: 20 mg via INTRAVENOUS

## 2018-02-13 MED ORDER — ROCURONIUM BROMIDE 100 MG/10ML IV SOLN
INTRAVENOUS | Status: DC | PRN
  Administered 2018-02-13: 30 mg via INTRAVENOUS

## 2018-02-13 MED ORDER — MEPERIDINE HCL 50 MG/ML IJ SOLN: 6.2500 mg | INTRAMUSCULAR | Status: DC | PRN

## 2018-02-13 MED ORDER — HYDROCODONE-ACETAMINOPHEN 7.5-325 MG PO TABS: 1.0000 | ORAL_TABLET | Freq: Once | ORAL | Status: DC | PRN

## 2018-02-13 MED ORDER — ROPIVACAINE HCL 5 MG/ML IJ SOLN
INTRAMUSCULAR | Status: DC | PRN
  Administered 2018-02-13: 30 mL via PERINEURAL

## 2018-02-13 MED ORDER — CEFAZOLIN SODIUM-DEXTROSE 2-4 GM/100ML-% IV SOLN
2.0000 g | INTRAVENOUS | Status: AC
  Administered 2018-02-13: 2 g via INTRAVENOUS

## 2018-02-13 MED ORDER — PROMETHAZINE HCL 25 MG/ML IJ SOLN
6.2500 mg | INTRAMUSCULAR | Status: DC | PRN
  Administered 2018-02-13: 6.25 mg via INTRAVENOUS

## 2018-02-13 MED ORDER — HYDROMORPHONE HCL 1 MG/ML IJ SOLN: 0.2500 mg | INTRAMUSCULAR | Status: DC | PRN

## 2018-02-13 MED ORDER — GLYCOPYRROLATE 0.2 MG/ML IJ SOLN
INTRAMUSCULAR | Status: DC | PRN
  Administered 2018-02-13: 0.1 mg via INTRAVENOUS

## 2018-02-13 MED ORDER — 0.9 % SODIUM CHLORIDE (POUR BTL) OPTIME
TOPICAL | Status: DC | PRN
  Administered 2018-02-13: 1000 mL

## 2018-02-13 MED ORDER — PROMETHAZINE HCL 25 MG/ML IJ SOLN
INTRAMUSCULAR | Status: AC
  Filled 2018-02-13: qty 1

## 2018-02-13 MED ORDER — GABAPENTIN 300 MG PO CAPS
ORAL_CAPSULE | ORAL | Status: AC
  Administered 2018-02-13: 300 mg via ORAL
  Filled 2018-02-13: qty 1

## 2018-02-13 MED ORDER — CLONIDINE HCL 0.2 MG PO TABS: 0.2000 mg | ORAL_TABLET | Freq: Once | ORAL | Status: DC

## 2018-02-13 MED ORDER — PROPOFOL 10 MG/ML IV BOLUS
INTRAVENOUS | Status: AC
  Filled 2018-02-13: qty 20

## 2018-02-13 MED ORDER — CHLORHEXIDINE GLUCONATE 4 % EX LIQD: 60.0000 mL | Freq: Once | CUTANEOUS | Status: DC

## 2018-02-13 MED ORDER — FENTANYL CITRATE (PF) 100 MCG/2ML IJ SOLN
INTRAMUSCULAR | Status: DC | PRN
  Administered 2018-02-13: 50 ug via INTRAVENOUS
  Administered 2018-02-13: 100 ug via INTRAVENOUS

## 2018-02-13 MED ORDER — MIDAZOLAM HCL 2 MG/2ML IJ SOLN
INTRAMUSCULAR | Status: AC
  Administered 2018-02-13: 2 mg via INTRAVENOUS
  Filled 2018-02-13: qty 2

## 2018-02-13 MED ORDER — ACETAMINOPHEN 500 MG PO TABS
ORAL_TABLET | ORAL | Status: AC
  Administered 2018-02-13: 1000 mg via ORAL
  Filled 2018-02-13: qty 2

## 2018-02-13 MED ORDER — ACETAMINOPHEN 500 MG PO TABS
1000.0000 mg | ORAL_TABLET | Freq: Once | ORAL | Status: AC
  Administered 2018-02-13: 1000 mg via ORAL

## 2018-02-13 MED ORDER — BUPIVACAINE HCL (PF) 0.25 % IJ SOLN
INTRAMUSCULAR | Status: AC
  Filled 2018-02-13: qty 30

## 2018-02-13 MED ORDER — SUGAMMADEX SODIUM 200 MG/2ML IV SOLN
INTRAVENOUS | Status: DC | PRN
  Administered 2018-02-13: 150 mg via INTRAVENOUS

## 2018-02-13 MED ORDER — ONDANSETRON HCL 4 MG/2ML IJ SOLN
INTRAMUSCULAR | Status: DC | PRN
  Administered 2018-02-13: 4 mg via INTRAVENOUS

## 2018-02-13 MED ORDER — CEFAZOLIN SODIUM-DEXTROSE 2-4 GM/100ML-% IV SOLN
INTRAVENOUS | Status: AC
  Filled 2018-02-13: qty 100

## 2018-02-13 MED ORDER — MIDAZOLAM HCL 2 MG/2ML IJ SOLN
INTRAMUSCULAR | Status: AC
  Filled 2018-02-13: qty 2

## 2018-02-13 MED ORDER — ROPIVACAINE HCL 5 MG/ML IJ SOLN: INTRAMUSCULAR | Status: DC | PRN

## 2018-02-13 MED ORDER — DEXMEDETOMIDINE HCL 200 MCG/2ML IV SOLN
INTRAVENOUS | Status: DC | PRN
  Administered 2018-02-13 (×3): 8 ug via INTRAVENOUS

## 2018-02-13 MED ORDER — FENTANYL CITRATE (PF) 250 MCG/5ML IJ SOLN
INTRAMUSCULAR | Status: AC
  Filled 2018-02-13: qty 5

## 2018-02-13 MED ORDER — FENTANYL CITRATE (PF) 100 MCG/2ML IJ SOLN
INTRAMUSCULAR | Status: AC
  Administered 2018-02-13: 100 ug via INTRAVENOUS
  Filled 2018-02-13: qty 2

## 2018-02-13 MED ORDER — MORPHINE SULFATE (PF) 4 MG/ML IV SOLN
INTRAVENOUS | Status: AC
  Filled 2018-02-13: qty 2

## 2018-02-13 MED ORDER — LIDOCAINE HCL (CARDIAC) PF 100 MG/5ML IV SOSY
PREFILLED_SYRINGE | INTRAVENOUS | Status: DC | PRN
  Administered 2018-02-13: 60 mg via INTRAVENOUS

## 2018-02-13 MED ORDER — LACTATED RINGERS IV SOLN
INTRAVENOUS | Status: DC | PRN
  Administered 2018-02-13 (×2): via INTRAVENOUS

## 2018-02-13 SURGICAL SUPPLY — 49 items
BLADE LONG MED 31MMX9MM (MISCELLANEOUS) ×1
BLADE LONG MED 31X9 (MISCELLANEOUS) ×2 IMPLANT
CLOSURE STERI-STRIP 1/2X4 (GAUZE/BANDAGES/DRESSINGS) ×1
CLOSURE STERI-STRIP 1/4X4 (GAUZE/BANDAGES/DRESSINGS) ×3 IMPLANT
CLSR STERI-STRIP ANTIMIC 1/2X4 (GAUZE/BANDAGES/DRESSINGS) ×2 IMPLANT
COVER SURGICAL LIGHT HANDLE (MISCELLANEOUS) ×3 IMPLANT
COVER WAND RF STERILE (DRAPES) ×3 IMPLANT
DRAPE C-ARM 42X72 X-RAY (DRAPES) ×3 IMPLANT
DRAPE INCISE IOBAN 66X45 STRL (DRAPES) ×6 IMPLANT
DRAPE ORTHO SPLIT 77X108 STRL (DRAPES) ×4
DRAPE SURG ORHT 6 SPLT 77X108 (DRAPES) ×2 IMPLANT
DRAPE U-SHAPE 47X51 STRL (DRAPES) ×6 IMPLANT
DRSG AQUACEL AG ADV 3.5X 6 (GAUZE/BANDAGES/DRESSINGS) IMPLANT
DRSG AQUACEL AG ADV 3.5X10 (GAUZE/BANDAGES/DRESSINGS) ×3 IMPLANT
DURAPREP 26ML APPLICATOR (WOUND CARE) ×3 IMPLANT
ELECT REM PT RETURN 9FT ADLT (ELECTROSURGICAL) ×3
ELECTRODE REM PT RTRN 9FT ADLT (ELECTROSURGICAL) ×1 IMPLANT
FIBERSTICK 2 (SUTURE) ×3 IMPLANT
GLOVE BIOGEL PI IND STRL 8 (GLOVE) ×1 IMPLANT
GLOVE BIOGEL PI INDICATOR 8 (GLOVE) ×2
GLOVE SURG ORTHO 8.0 STRL STRW (GLOVE) ×3 IMPLANT
GOWN STRL REUS W/ TWL LRG LVL3 (GOWN DISPOSABLE) ×2 IMPLANT
GOWN STRL REUS W/ TWL XL LVL3 (GOWN DISPOSABLE) ×1 IMPLANT
GOWN STRL REUS W/TWL LRG LVL3 (GOWN DISPOSABLE) ×4
GOWN STRL REUS W/TWL XL LVL3 (GOWN DISPOSABLE) ×2
KIT BASIN OR (CUSTOM PROCEDURE TRAY) ×3 IMPLANT
KIT TURNOVER KIT B (KITS) ×3 IMPLANT
MANIFOLD NEPTUNE II (INSTRUMENTS) ×3 IMPLANT
NEEDLE HYPO 25GX1X1/2 BEV (NEEDLE) IMPLANT
NS IRRIG 1000ML POUR BTL (IV SOLUTION) ×6 IMPLANT
PACK SHOULDER (CUSTOM PROCEDURE TRAY) ×3 IMPLANT
PAD ARMBOARD 7.5X6 YLW CONV (MISCELLANEOUS) ×6 IMPLANT
SLING ARM IMMOBILIZER LRG (SOFTGOODS) IMPLANT
SLING ARM IMMOBILIZER MED (SOFTGOODS) IMPLANT
SPONGE LAP 4X18 RFD (DISPOSABLE) ×3 IMPLANT
SUCTION FRAZIER HANDLE 10FR (MISCELLANEOUS)
SUCTION TUBE FRAZIER 10FR DISP (MISCELLANEOUS) IMPLANT
SUT MNCRL AB 4-0 PS2 18 (SUTURE) ×3 IMPLANT
SUT RETRIEVER MED (INSTRUMENTS) ×3 IMPLANT
SUT SILK 2 0 TIES 10X30 (SUTURE) ×3 IMPLANT
SUT VIC AB 0 CT1 27 (SUTURE) ×2
SUT VIC AB 0 CT1 27XBRD ANBCTR (SUTURE) ×1 IMPLANT
SUT VIC AB 2-0 CT1 27 (SUTURE) ×2
SUT VIC AB 2-0 CT1 TAPERPNT 27 (SUTURE) ×1 IMPLANT
SYR CONTROL 10ML LL (SYRINGE) ×3 IMPLANT
SYSTEM AC REPAIR LO-PRO KNTLS (Anchor) ×3 IMPLANT
TENDON PRE-SUTURED FLEXIGRAFT (Tissue) ×3 IMPLANT
TOWEL OR 17X24 6PK STRL BLUE (TOWEL DISPOSABLE) ×3 IMPLANT
TOWEL OR 17X26 10 PK STRL BLUE (TOWEL DISPOSABLE) ×3 IMPLANT

## 2018-02-13 NOTE — Transfer of Care (Signed)
Immediate Anesthesia Transfer of Care Note  Patient: Stephen Byrd  Procedure(s) Performed: LEFT SHOULDER ACROMIOCLAVICULAR RECONSTRUCTION (Left )  Patient Location: PACU  Anesthesia Type:GA combined with regional for post-op pain  Level of Consciousness: drowsy, patient cooperative and responds to stimulation  Airway & Oxygen Therapy: Patient Spontanous Breathing  Post-op Assessment: Report given to RN and Post -op Vital signs reviewed and stable  Post vital signs: Reviewed and stable  Last Vitals:  Vitals Value Taken Time  BP 109/62 02/13/2018  6:41 PM  Temp    Pulse 56 02/13/2018  6:46 PM  Resp    SpO2 98 % 02/13/2018  6:46 PM  Vitals shown include unvalidated device data.  Last Pain:  Vitals:   02/13/18 1841  TempSrc:   PainSc: (P) Asleep      Patients Stated Pain Goal: 3 (02/13/18 1254)  Complications: No apparent anesthesia complications

## 2018-02-13 NOTE — Anesthesia Postprocedure Evaluation (Signed)
Anesthesia Post Note  Patient: Stephen Byrd  Procedure(s) Performed: LEFT SHOULDER ACROMIOCLAVICULAR RECONSTRUCTION (Left )     Patient location during evaluation: PACU Anesthesia Type: General Level of consciousness: awake Pain management: pain level controlled Vital Signs Assessment: post-procedure vital signs reviewed and stable Respiratory status: spontaneous breathing Cardiovascular status: stable Postop Assessment: no apparent nausea or vomiting Anesthetic complications: no    Last Vitals:  Vitals:   02/13/18 1926 02/13/18 1941  BP: 111/64 117/68  Pulse: (!) 44 (!) 46  Resp: 14 15  Temp:    SpO2: 98% 100%    Last Pain:  Vitals:   02/13/18 1941  TempSrc:   PainSc: 0-No pain                 Romesha Scherer

## 2018-02-13 NOTE — H&P (Signed)
Stephen Byrd is an 17 y.o. male.   Chief Complaint: Left shoulder pain HPI: Patient presents 2 years out from fall on left arm.  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  Reports shoulder pain with overhead activity.  Plain radiographs demonstrate grade 3 to grade 5 AC separation with disruption of the coracoclavicular ligament.  Cc space on the unaffected right shoulder is approximately 11 mm on the left shoulder to approximately 22 mm.  He presents now for operative management after isolation risk benefits.  No personal or family history of DVT or pulmonary embolism  Past Medical History:  Diagnosis Date  . ADHD (attention deficit hyperactivity disorder)   . Bipolar 1 disorder (HCC)   . Heart murmur     Past Surgical History:  Procedure Laterality Date  . NO PAST SURGERIES      Family History  Problem Relation Age of Onset  . Hypertension Mother   . Hypertension Maternal Grandfather    Social History:  reports that he has never smoked. He has never used smokeless tobacco. He reports that he has current or past drug history. Drug: Marijuana. Frequency: 2.00 times per week. He reports that he does not drink alcohol.  Allergies: No Known Allergies  Medications Prior to Admission  Medication Sig Dispense Refill  . HYDROcodone-acetaminophen (NORCO/VICODIN) 5-325 MG tablet Take 1 tablet by mouth every 6 (six) hours as needed for moderate pain. (Patient not taking: Reported on 02/12/2018) 30 tablet 0  . ibuprofen (ADVIL,MOTRIN) 800 MG tablet Take 1 tablet (800 mg total) by mouth 3 (three) times daily. (Patient not taking: Reported on 02/12/2018) 21 tablet 0  . oxyCODONE-acetaminophen (PERCOCET) 5-325 MG tablet Take 1 tablet by mouth every 6 (six) hours as needed for severe pain. (Patient not taking: Reported on 02/12/2018) 10 tablet 0    Results for orders placed or performed during the hospital encounter of 02/13/18 (from the past 48 hour(s))  CBC     Status: None   Collection  Time: 02/13/18  1:08 PM  Result Value Ref Range   WBC 4.6 4.0 - 10.5 K/uL   RBC 4.89 4.22 - 5.81 MIL/uL   Hemoglobin 13.2 13.0 - 17.0 g/dL   HCT 16.1 09.6 - 04.5 %   MCV 85.7 80.0 - 100.0 fL   MCH 27.0 26.0 - 34.0 pg   MCHC 31.5 30.0 - 36.0 g/dL   RDW 40.9 81.1 - 91.4 %   Platelets 205 150 - 400 K/uL   nRBC 0.0 0.0 - 0.2 %    Comment: Performed at [facility], [address] [place], [place] [address]   No results found.  Review of Systems  Musculoskeletal: Positive for joint pain.  All other systems reviewed and are negative.   Blood pressure 112/60, pulse (!) 43, temperature 97.7 F (36.5 C), temperature source Oral, resp. rate 18, height 5\' 7"  (1.702 m), weight 68 kg, SpO2 100 %. Physical Exam  Constitutional: He appears well-developed.  HENT:  Head: Normocephalic.  Eyes: Pupils are equal, round, and reactive to light.  Neck: Normal range of motion.  Cardiovascular: Normal rate.  Respiratory: Effort normal.  Neurological: He is alert.  Skin: Skin is warm.  Psychiatric: He has a normal mood and affect.  Left shoulder demonstrates prominence of the distal clavicle.  There is slight shortening of the shoulder girdle.  Impingement signs are positive.  Rotator cuff strength is intact.  O'Brien's testing is negative.  Patient does have pain and tenderness  at the [facility] joint.  Motor or sensory function to the hand is intact  Assessment/Plan Impression is coracoclavicular ligament disruption and AC dislocation with superior migration of the [facility] joint and distal clavicle.  Plan is distal clavicle excision with coracoclavicular ligament reconstruction.  Risks and benefits discussed including but not limited to infection nerve vessel damage shoulder stiffness as well as incomplete pain relief.  All questions answered.  Anticipate discharge home after procedure.  All questions answered  Burnard Bunting, MD 02/13/2018, 3:02 PM

## 2018-02-13 NOTE — Anesthesia Procedure Notes (Signed)
Procedure Name: Intubation Date/Time: 02/13/2018 3:43 PM Performed by: Tillman Abide, CRNA Pre-anesthesia Checklist: Patient identified, Emergency Drugs available, Suction available and Patient being monitored Patient Re-evaluated:Patient Re-evaluated prior to induction Oxygen Delivery Method: Circle System Utilized Preoxygenation: Pre-oxygenation with 100% oxygen Induction Type: IV induction Ventilation: Mask ventilation without difficulty Laryngoscope Size: Miller and 2 Grade View: Grade I Tube type: Oral Tube size: 7.5 mm Number of attempts: 1 Airway Equipment and Method: Stylet Placement Confirmation: ETT inserted through vocal cords under direct vision,  positive ETCO2 and breath sounds checked- equal and bilateral Secured at: 22 cm Tube secured with: Tape Dental Injury: Teeth and Oropharynx as per pre-operative assessment

## 2018-02-13 NOTE — Brief Op Note (Signed)
02/13/2018  6:45 PM  PATIENT:  Stephen Byrd  17 y.o. male  PRE-OPERATIVE DIAGNOSIS:  left shoulder acromioclavicular separation  POST-OPERATIVE DIAGNOSIS:  left shoulder acromioclavicular separation  PROCEDURE:  Procedure(s): LEFT SHOULDER ACROMIOCLAVICULAR RECONSTRUCTION  SURGEON:  Surgeon(s): August Saucer, Corrie Mckusick, MD  ASSISTANT: Chilton Si RNFA  ANESTHESIA:   general  EBL: 25 ml    Total I/O In: 1000 [I.V.:1000] Out: -   BLOOD ADMINISTERED: none  DRAINS: none   LOCAL MEDICATIONS USED:  none  SPECIMEN:  No Specimen  COUNTS:  YES  TOURNIQUET:  * No tourniquets in log *  DICTATION: .Other Dictation: Dictation Number [id number]  PLAN OF CARE: Discharge to home after PACU  PATIENT DISPOSITION:  PACU - hemodynamically stable

## 2018-02-13 NOTE — Transfer of Care (Deleted)
Immediate Anesthesia Transfer of Care Note  Patient: Stephen Byrd  Procedure(s) Performed: LEFT SHOULDER ACROMIOCLAVICULAR RECONSTRUCTION (Left )  Patient Location: PACU  Anesthesia Type:General  Level of Consciousness: awake, drowsy and patient cooperative  Airway & Oxygen Therapy: Patient Spontanous Breathing  Post-op Assessment: Report given to RN and Post -op Vital signs reviewed and stable  Post vital signs: Reviewed and stable  Last Vitals:  Vitals Value Taken Time  BP    Temp    Pulse    Resp    SpO2      Last Pain:  Vitals:   02/13/18 1243  TempSrc: Oral      Patients Stated Pain Goal: 3 (02/13/18 1254)  Complications: No apparent anesthesia complications

## 2018-02-13 NOTE — Anesthesia Procedure Notes (Signed)
Anesthesia Regional Block: Interscalene brachial plexus block   Pre-Anesthetic Checklist: ,, timeout performed, Correct Patient, Correct Site, Correct Laterality, Correct Procedure, Correct Position, site marked, Risks and benefits discussed, at surgeon's request and post-op pain management  Laterality: Upper and Left  Prep: Betadine, chloraprep, alcohol swabs       Needles:  Injection technique: Single-shot  Needle Type: Echogenic Needle     Needle Length: 5cm  Needle Gauge: 22     Additional Needles:   Procedures:,,,, ultrasound used (permanent image in chart),,,,  Narrative:  Start time: 02/13/2018 2:00 PM End time: 02/13/2018 2:08 PM  Performed by: Personally  Anesthesiologist: Sharee Holster, MD  Additional Notes: Block assessed prior to start of surgery

## 2018-02-14 ENCOUNTER — Encounter (HOSPITAL_COMMUNITY): Payer: Self-pay | Admitting: Orthopedic Surgery

## 2018-02-14 NOTE — Op Note (Signed)
NAME: Stephen Byrd, Stephen Byrd MEDICAL RECORD UJ:[id number] ACCOUNT [id number] DATE OF BIRTH:[birth year] FACILITY: [facility] LOCATION: [facility]-PERIOP PHYSICIAN:Verne Cove Diamantina Providence, MD  OPERATIVE REPORT  DATE OF PROCEDURE:  02/13/2018  PREOPERATIVE DIAGNOSES:  Left shoulder acromioclavicular separation and coracoclavicular ligament disruption.  POSTOPERATIVE DIAGNOSES:  Left shoulder acromioclavicular separation and coracoclavicular ligament disruption.  PROCEDURE:  Left shoulder distal clavicle excision with coracoclavicular ligament fixation and reconstruction.  SURGEON:  Cammy Copa, MD  ASSISTANT:  April Green, RNFA  INDICATIONS:  The patient is a 17 year old patient with left AC separation, grade III to grade V.  He is having symptoms.  Injury 2 years ago.  Presents for operative management after difficulty and pain with overhead lifting.  The patient understands  the risks and benefits.  IMPLANTS UTILIZED:  Arthrex Endobutton-type fixation for the coracoclavicular ligament reconstruction along with allograft hamstring tendon for the biological portion of the fixation.  PROCEDURE IN DETAIL:  The patient was brought to the operating room where general anesthetic was induced.  Preoperative antibiotics were administered.  Timeout was called.  The patient was placed in the beach-chair position with the head in neutral  position.  Left arm was then prescrubbed with alcohol and Betadine and allowed to air dry.  Prepped with DuraPrep solution and draped in the usual sterile manner.  Collier Flowers was used to cover the operative field.  Timeout was called.  Incision was made from  the posterior aspect of the clavicle in line with the coracoid process.  Skin and subcutaneous tissues were sharply divided.  Crossing sensory nerves were protected when possible.  Distal clavicle was first identified.  Full-thickness periosteal flaps  were elevated.  The distal end of the clavicle, 1 cm measured, was resected with the  oscillating saw.  At this time, attention was directed towards the coracoclavicular joint.  A deltoid split was made over the coracoid process in blunt fashion.   Retractors were placed.  The coracoid was palpated and visualized.  Drilling was then performed at a distance of approximately 25-30 mm from the resected end of the clavicle.  A drill bit was placed, and after blunt dissection underneath the coracoid  process, the drill bit was placed in the center portion and coracoid was drilled.  The suture was passed.  The clavicle was then drilled in line with  this location.  The suture was passed here as well.  The Top Hat device was then passed first  antegrade, and then the FlipButton was passed retrograde again with care being taken to avoid injury to underlying neurovascular structures below the coracoid process.  Once this fixation was present, the allograft was passed using the Concept cannula  underneath the coracoid as well, first with a suture and then with the graft itself.  Again, care was taken to avoid all injury to underlying neurovascular structures.  Under fluoroscopic guidance, the [facility] joint was reduced and restored to a more normal  coracoclavicular distance, which was comparable to his other side.  This was checked under fluoroscopy.  At this time, the tendon, which had been passed prior to fixation, was then wrapped and sutured into position.  This gave it biologic as well as  immediate fixation for the graft.  At this time, thorough irrigation was performed.  The fascia was then repaired over the distal clavicle.  The deltoid split was also closed using #1 Vicryl suture.  Skin was closed using interrupted inverted 0 Vicryl  suture, 2-0 Vicryl suture and a 3-0 Monocryl.  An Aquacel  dressing was placed.  The patient tolerated the procedure well without immediate complications.  He was transferred to the recovery room in stable condition with a shoulder immobilizer.  LN/NUANCE   D:02/13/2018 T:02/14/2018 JOB:[id number]

## 2018-02-21 ENCOUNTER — Ambulatory Visit (INDEPENDENT_AMBULATORY_CARE_PROVIDER_SITE_OTHER): Payer: Medicaid Other

## 2018-02-21 ENCOUNTER — Encounter (INDEPENDENT_AMBULATORY_CARE_PROVIDER_SITE_OTHER): Payer: Self-pay | Admitting: Orthopedic Surgery

## 2018-02-21 ENCOUNTER — Ambulatory Visit (INDEPENDENT_AMBULATORY_CARE_PROVIDER_SITE_OTHER): Payer: Medicaid Other | Admitting: Orthopedic Surgery

## 2018-02-21 DIAGNOSIS — S43102A Unspecified dislocation of left acromioclavicular joint, initial encounter: Secondary | ICD-10-CM

## 2018-02-21 MED ORDER — HYDROCODONE-ACETAMINOPHEN 5-325 MG PO TABS: ORAL_TABLET | ORAL | 0 refills | Status: DC

## 2018-02-22 NOTE — Progress Notes (Signed)
   Post-Op Visit Note   Patient: Finbar Nippert Oestreich           Date of Birth: [birth year]           MRN: [id number] Visit Date: 02/21/2018 PCP: Tylene Fantasia., PA-C   Assessment & Plan:  Chief Complaint:  Chief Complaint  Patient presents with  . Left Shoulder - Routine Post Op   Visit Diagnoses:  1. Acromioclavicular joint separation, left, initial encounter     Plan: Jb is a patient with left shoulder distal clavicle excision in coracoclavicular ligament reconstruction.  Patient also had allograft fixation in addition to tight rope fixation across the coracoclavicular interval.  In general he is doing well.  He is got decent passive range of motion of the shoulder.  Radiographs look good today.  I will keep him in the sling for about 3 more weeks before we start really moving the shoulder.  He is taking oxycodone and muscle relaxer.  Follow-up in 3-1/2 weeks for clinical recheck and initiation of shoulder range of motion exercises.  Follow-Up Instructions: Return in about 3 weeks (around 03/14/2018).   Orders:  Orders Placed This Encounter  Procedures  . XR Clavicle Left   Meds ordered this encounter  Medications  . HYDROcodone-acetaminophen (NORCO/VICODIN) 5-325 MG tablet    Sig: 1 po q 6-8 hrs prn pain    Dispense:  30 tablet    Refill:  0    Imaging: Xr Clavicle Left  Result Date: 02/22/2018 AP bilateral clavicles reviewed.  Coracoclavicular fixation has been performed on that left shoulder with good restoration of coracoclavicular distance.  No complicating features.   PMFS History: There are no active problems to display for this patient.  Past Medical History:  Diagnosis Date  . ADHD (attention deficit hyperactivity disorder)   . Bipolar 1 disorder (HCC)   . Heart murmur     Family History  Problem Relation Age of Onset  . Hypertension Mother   . Hypertension Maternal Grandfather     Past Surgical History:  Procedure Laterality Date  .  ACROMIO-CLAVICULAR JOINT REPAIR Left 02/13/2018   Procedure: LEFT SHOULDER ACROMIOCLAVICULAR RECONSTRUCTION;  Surgeon: Cammy Copa, MD;  Location: [facility] OR;  Service: Orthopedics;  Laterality: Left;  . NO PAST SURGERIES     Social History   Occupational History  . Not on file  Tobacco Use  . Smoking status: Never Smoker  . Smokeless tobacco: Never Used  Substance and Sexual Activity  . Alcohol use: No  . Drug use: Yes    Frequency: 2.0 times per week    Types: Marijuana  . Sexual activity: Not on file

## 2018-03-14 ENCOUNTER — Ambulatory Visit (INDEPENDENT_AMBULATORY_CARE_PROVIDER_SITE_OTHER): Payer: Medicaid Other | Admitting: Orthopedic Surgery

## 2018-03-14 ENCOUNTER — Encounter (INDEPENDENT_AMBULATORY_CARE_PROVIDER_SITE_OTHER): Payer: Self-pay | Admitting: Orthopedic Surgery

## 2018-03-14 DIAGNOSIS — S43102A Unspecified dislocation of left acromioclavicular joint, initial encounter: Secondary | ICD-10-CM

## 2018-03-15 ENCOUNTER — Telehealth (INDEPENDENT_AMBULATORY_CARE_PROVIDER_SITE_OTHER): Payer: Self-pay | Admitting: Orthopedic Surgery

## 2018-03-15 NOTE — Telephone Encounter (Signed)
Patient's mother [name]([name]) called advised patient need a note stating he can work without any restrictions. [name] said his employer is not allowing him to work at all. The number to contact [name] is [phone]

## 2018-03-15 NOTE — Telephone Encounter (Signed)
Ok to work without restrictions 1 week from this coming monday

## 2018-03-15 NOTE — Telephone Encounter (Signed)
Please advise. You had given patient note yesterday with work restrictions. Ok for him to work without restrictions? Patients mom is calling.

## 2018-03-15 NOTE — Telephone Encounter (Signed)
IC advised.  Note written stating such and put up front for pick up.

## 2018-03-17 ENCOUNTER — Encounter (INDEPENDENT_AMBULATORY_CARE_PROVIDER_SITE_OTHER): Payer: Self-pay | Admitting: Orthopedic Surgery

## 2018-03-17 NOTE — Progress Notes (Signed)
   Post-Op Visit Note   Patient: Stephen Byrd           Date of Birth: 03/13/2001           MRN: [id number] Visit Date: 03/14/2018 PCP: Tylene FantasiaMuse, Rochelle D., PA-C   Assessment & Plan:  Chief Complaint:  Chief Complaint  Patient presents with  . Left Shoulder - Follow-up   Visit Diagnoses:  1. Acromioclavicular joint separation, left, initial encounter     Plan: Stephen Byrd is a [facility]  He is doing well.  He has excellent range of motion of the shoulder.  He would like to return to work.  On exam he does have excellent range of motion of the shoulder and the symmetric appearing AC joints.  Plan at this time is that he needs a little bit more time for this biologic component of the reconstruction to heal.  We tried let him go back to work sooner on a restricted schedule but he needs to be no restrictions.  For that reason I think it will be okay for him to return to work 1 week from this coming Monday with no restrictions which will put him beyond 5 weeks out from his reconstruction.  I will see him back in 5 weeks for final check.  Follow-Up Instructions: Return in about 5 weeks (around 04/18/2018).   Orders:  No orders of the defined types were placed in this encounter.  No orders of the defined types were placed in this encounter.   Imaging: No results found.  PMFS History: There are no active problems to display for this patient.  Past Medical History:  Diagnosis Date  . ADHD (attention deficit hyperactivity disorder)   . Bipolar 1 disorder (HCC)   . Heart murmur     Family History  Problem Relation Age of Onset  . Hypertension Mother   . Hypertension Maternal Grandfather     Past Surgical History:  Procedure Laterality Date  . ACROMIO-CLAVICULAR JOINT REPAIR Left 02/13/2018   Procedure: LEFT SHOULDER ACROMIOCLAVICULAR RECONSTRUCTION;  Surgeon: Cammy Copaean, Bane Hagy Scott, MD;  [facility];  Service: Orthopedics;  Laterality: Left;  .  NO PAST SURGERIES     Social History   Occupational History  . Not on file  Tobacco Use  . Smoking status: Never Smoker  . Smokeless tobacco: Never Used  Substance and Sexual Activity  . Alcohol use: No  . Drug use: Yes    Frequency: 2.0 times per week    Types: Marijuana  . Sexual activity: Not on file

## 2018-03-28 ENCOUNTER — Other Ambulatory Visit (INDEPENDENT_AMBULATORY_CARE_PROVIDER_SITE_OTHER): Payer: Self-pay | Admitting: Orthopedic Surgery

## 2018-03-28 MED ORDER — ACETAMINOPHEN-CODEINE #3 300-30 MG PO TABS: 1.0000 | ORAL_TABLET | Freq: Three times a day (TID) | ORAL | 0 refills | Status: DC | PRN

## 2018-03-28 NOTE — Telephone Encounter (Signed)
Patient's mother [name]([name]) called advised patient went to work but was sent back home because he could not use his arm. She asked if patient can get something for pain. She said he use the [facility] in [place]. The number to contact [name] is [phone]

## 2018-03-28 NOTE — Telephone Encounter (Signed)
Ok for t 3 1  po q 8 # 30 pls clal thx

## 2018-03-28 NOTE — Telephone Encounter (Signed)
Please advise 

## 2018-03-28 NOTE — Telephone Encounter (Signed)
IC pharm and LMVM and IC mom advising.

## 2018-04-06 ENCOUNTER — Emergency Department (HOSPITAL_COMMUNITY)
Admission: EM | Admit: 2018-04-06 | Discharge: 2018-04-06 | Disposition: A | Discharge status: Home / Self Care | Payer: Medicaid Other | Attending: Emergency Medicine | Admitting: Emergency Medicine

## 2018-04-06 ENCOUNTER — Encounter (HOSPITAL_COMMUNITY): Payer: Self-pay

## 2018-04-06 ENCOUNTER — Emergency Department (HOSPITAL_COMMUNITY): Payer: Medicaid Other

## 2018-04-06 ENCOUNTER — Other Ambulatory Visit: Payer: Self-pay

## 2018-04-06 DIAGNOSIS — J111 Influenza due to unidentified influenza virus with other respiratory manifestations: Secondary | ICD-10-CM | POA: Insufficient documentation

## 2018-04-06 DIAGNOSIS — R05 Cough: Secondary | ICD-10-CM | POA: Diagnosis present

## 2018-04-06 DIAGNOSIS — R6889 Other general symptoms and signs: Secondary | ICD-10-CM

## 2018-04-06 MED ORDER — IBUPROFEN 400 MG PO TABS
600.0000 mg | ORAL_TABLET | Freq: Once | ORAL | Status: AC
  Administered 2018-04-06: 600 mg via ORAL
  Filled 2018-04-06: qty 2

## 2018-04-06 MED ORDER — OSELTAMIVIR PHOSPHATE 75 MG PO CAPS
75.0000 mg | ORAL_CAPSULE | Freq: Once | ORAL | Status: AC
  Administered 2018-04-06: 75 mg via ORAL
  Filled 2018-04-06: qty 1

## 2018-04-06 MED ORDER — OSELTAMIVIR PHOSPHATE 75 MG PO CAPS: 75.0000 mg | ORAL_CAPSULE | Freq: Two times a day (BID) | ORAL | 0 refills | Status: DC

## 2018-04-06 MED ORDER — ALBUTEROL SULFATE HFA 108 (90 BASE) MCG/ACT IN AERS
2.0000 | INHALATION_SPRAY | Freq: Once | RESPIRATORY_TRACT | Status: AC
  Administered 2018-04-06: 2 via RESPIRATORY_TRACT
  Filled 2018-04-06: qty 6.7

## 2018-04-06 MED ORDER — HYDROCOD POLST-CPM POLST ER 10-8 MG/5ML PO SUER
5.0000 mL | Freq: Once | ORAL | Status: AC
  Administered 2018-04-06: 5 mL via ORAL
  Filled 2018-04-06: qty 5

## 2018-04-06 MED ORDER — BENZONATATE 100 MG PO CAPS: 100.0000 mg | ORAL_CAPSULE | Freq: Three times a day (TID) | ORAL | 0 refills | Status: DC

## 2018-04-06 MED ORDER — IPRATROPIUM-ALBUTEROL 0.5-2.5 (3) MG/3ML IN SOLN
3.0000 mL | Freq: Once | RESPIRATORY_TRACT | Status: AC
  Administered 2018-04-06: 3 mL via RESPIRATORY_TRACT
  Filled 2018-04-06: qty 3

## 2018-04-06 NOTE — ED Notes (Signed)
Call to resp 

## 2018-04-06 NOTE — ED Notes (Signed)
Pt reports flulike sx   He has not had a flu shot

## 2018-04-06 NOTE — ED Triage Notes (Signed)
Pt reports flu like symptoms including cough, congestion, body aches.  Sx started today, pt took Theraflu less than an hour ago. Pt girlfriend had same symptoms but they are resolving now.

## 2018-04-06 NOTE — Discharge Instructions (Addendum)
Use the inhaler 2 puffs every 4 hours as needed for cough and wheezing. Take the medications as directed. Follow up with your doctor. Return here for worsening symptoms.

## 2018-04-06 NOTE — ED Provider Notes (Signed)
[facility] EMERGENCY DEPARTMENT Provider Note   CSN: [id number] Arrival date & time: 04/06/18  1928     History   Chief Complaint Chief Complaint  Patient presents with  . flu like symptoms    HPI Stephen Byrd is a 17 y.o. male who presents to the ED with flu like symptoms. Patient reports cough, congestion, body aches. The symptoms started today. Patient took teraflu less than an hour before coming to the ED. Patient reports his girlfriend had the same symptoms but her symptoms are getting better.  The history is provided by the patient. No language interpreter was used.  Influenza  Presenting symptoms: cough, fatigue, fever, headache, myalgias, rhinorrhea and sore throat   Presenting symptoms: no diarrhea, no nausea, no shortness of breath and no vomiting   Severity:  Moderate Onset quality:  Gradual Duration:  1 day Progression:  Worsening Chronicity:  New Relieved by:  Decongestant, OTC medications, drinking and rest Worsened by:  Nothing Ineffective treatments:  OTC medications Associated symptoms: chills, decreased physical activity and nasal congestion   Associated symptoms: no ear pain, no neck stiffness and no syncope   Risk factors: sick contacts     Past Medical History:  Diagnosis Date  . ADHD (attention deficit hyperactivity disorder)   . Bipolar 1 disorder (HCC)   . Heart murmur     There are no active problems to display for this patient.   Past Surgical History:  Procedure Laterality Date  . ACROMIO-CLAVICULAR JOINT REPAIR Left 02/13/2018   Procedure: LEFT SHOULDER ACROMIOCLAVICULAR RECONSTRUCTION;  Surgeon: Cammy Copaean, Gregory Scott, MD;  [facility];  Service: Orthopedics;  Laterality: Left;  . NO PAST SURGERIES          Home Medications    Prior to Admission medications   Medication Sig Start Date End Date Taking? Authorizing Provider  acetaminophen-codeine (TYLENOL #3) 300-30 MG tablet Take 1 tablet by mouth every 8 (eight) hours as  needed for moderate pain. 03/28/18   Cammy Copaean, Gregory Scott, MD  benzonatate (TESSALON) 100 MG capsule Take 1 capsule (100 mg total) by mouth every 8 (eight) hours. 04/06/18   Janne NapoleonNeese, Lillianah Swartzentruber M, NP  HYDROcodone-acetaminophen (NORCO/VICODIN) 5-325 MG tablet 1 po q 6-8 hrs prn pain 02/21/18   Cammy Copaean, Gregory Scott, MD  oseltamivir (TAMIFLU) 75 MG capsule Take 1 capsule (75 mg total) by mouth every 12 (twelve) hours. 04/06/18   Janne NapoleonNeese, Aime Carreras M, NP    Family History Family History  Problem Relation Age of Onset  . Hypertension Mother   . Hypertension Maternal Grandfather     Social History Social History   Tobacco Use  . Smoking status: Never Smoker  . Smokeless tobacco: Never Used  Substance Use Topics  . Alcohol use: No  . Drug use: Yes    Frequency: 2.0 times per week    Types: Marijuana     Allergies   Patient has no known allergies.   Review of Systems Review of Systems  Constitutional: Positive for chills, fatigue and fever.  HENT: Positive for congestion, rhinorrhea and sore throat. Negative for ear pain and trouble swallowing.   Eyes: Positive for redness and itching. Negative for discharge.  Respiratory: Positive for cough. Negative for shortness of breath.   Cardiovascular: Chest pain: with cough.  Gastrointestinal: Negative for abdominal pain, diarrhea, nausea and vomiting.  Genitourinary: Negative for decreased urine volume.  Musculoskeletal: Positive for myalgias. Negative for neck stiffness.  Skin: Negative for rash.  Neurological: Positive for headaches.  Psychiatric/Behavioral:  Negative for confusion.     Physical Exam Updated Vital Signs BP 111/68 (BP Location: Right Arm)   Pulse 79   Temp 99.4 F (37.4 C) (Oral)   Resp 20   Ht 5\' 7"  (1.702 m)   Wt 63.5 kg   SpO2 100%   BMI 21.93 kg/m   Physical Exam  Constitutional: He appears well-developed and well-nourished. No distress.  HENT:  Head: Normocephalic.  Right Ear: Tympanic membrane normal.  Left Ear:  Tympanic membrane normal.  Nose: Rhinorrhea present.  Mouth/Throat: Uvula is midline. No posterior oropharyngeal edema or posterior oropharyngeal erythema.  Eyes: Pupils are equal, round, and reactive to light. EOM are normal. Right conjunctiva is injected. Left conjunctiva is injected.  Neck: Neck supple.  Cardiovascular: Normal rate and regular rhythm.  Pulmonary/Chest: Effort normal. He has wheezes. He has no rales.  Abdominal: Soft. Bowel sounds are normal. There is no tenderness.  Patient states a little sore with cough  Musculoskeletal: Normal range of motion.  Neurological: He is alert.  Skin: Skin is warm and dry.  Psychiatric: He has a normal mood and affect.  Nursing note and vitals reviewed.    ED Treatments / Results  Labs (all labs ordered are listed, but only abnormal results are displayed) Labs Reviewed - No data to display  Radiology Dg Chest 2 View  Result Date: 04/06/2018 CLINICAL DATA:  Cough, congestion body. EXAM: CHEST - 2 VIEW COMPARISON:  None. FINDINGS: Cardiomediastinal silhouette is normal. No pleural effusions or focal consolidations. Trachea projects midline and there is no pneumothorax. Soft tissue planes and included osseous structures are non-suspicious. IMPRESSION: Normal chest radiograph. Electronically Signed   By: Awilda Metro M.D.   On: 04/06/2018 20:28    Procedures Procedures (including critical care time)  Medications Ordered in ED Medications  ipratropium-albuterol (DUONEB) 0.5-2.5 (3) MG/3ML nebulizer solution 3 mL (has no administration in time range)  albuterol (PROVENTIL HFA;VENTOLIN HFA) 108 (90 Base) MCG/ACT inhaler 2 puff (has no administration in time range)  ibuprofen (ADVIL,MOTRIN) tablet 600 mg (600 mg Oral Given 04/06/18 2016)   Re examined after breathing treatment. Wheezing cleared.   Initial Impression / Assessment and Plan / ED Course  I have reviewed the triage vital signs and the nursing notes. SUBJECTIVE:  Stephen Byrd is a 17 y.o. male who present complaining of flu-like symptoms: fevers, chills, myalgias, congestion, sore throat and cough for 1 day. Denies dyspnea.  OBJECTIVE: Appears moderately ill but not toxic; temperature as noted in vitals. Ears normal. Throat and pharynx normal.  Neck supple. No adenopathy in the neck. Sinuses non tender. The chest is clear after breathing treatment.   ASSESSMENT: Influenza like symptoms  PLAN: Symptomatic therapy suggested: rest, increase fluids, gargle prn for sore throat, use mist of vaporizer prn and call prn if symptoms persist or worsen. Return for worsening symptoms.  Final Clinical Impressions(s) / ED Diagnoses   Final diagnoses:  Flu-like symptoms    ED Discharge Orders         Ordered    oseltamivir (TAMIFLU) 75 MG capsule  Every 12 hours     04/06/18 2041    benzonatate (TESSALON) 100 MG capsule  Every 8 hours     04/06/18 2041           Kerrie [place], NP 04/06/18 2111    Vanetta Mulders, MD 04/07/18 1905

## 2018-04-09 ENCOUNTER — Telehealth (INDEPENDENT_AMBULATORY_CARE_PROVIDER_SITE_OTHER): Payer: Self-pay

## 2018-04-09 NOTE — Telephone Encounter (Signed)
Insurance doesn't want to cover the Tylenol#3, want to give him Tramadol instead?

## 2018-04-09 NOTE — Telephone Encounter (Signed)
Y 1 po q 8 # 30

## 2018-04-10 ENCOUNTER — Other Ambulatory Visit (INDEPENDENT_AMBULATORY_CARE_PROVIDER_SITE_OTHER): Payer: Self-pay | Admitting: Radiology

## 2018-04-10 MED ORDER — TRAMADOL HCL 50 MG PO TABS: 50.0000 mg | ORAL_TABLET | Freq: Three times a day (TID) | ORAL | 0 refills | Status: DC | PRN

## 2018-04-10 MED ORDER — ACETAMINOPHEN-CODEINE #3 300-30 MG PO TABS: 1.0000 | ORAL_TABLET | Freq: Three times a day (TID) | ORAL | 0 refills | Status: DC | PRN

## 2018-04-10 NOTE — Addendum Note (Signed)
Addended by: Cherre HugerMAY, Jaishawn Witzke E on: 04/10/2018 12:50 PM   Modules accepted: Orders

## 2018-04-10 NOTE — Progress Notes (Signed)
T#3 is covered per [employer] [place] [employer] [place] [facility], so we did not cancel that Rx, they will dispense 1 week supply to patient no charge per ins. Then can have remaining supply after the one week.

## 2018-04-10 NOTE — Telephone Encounter (Signed)
[facility] also asked to cancel the T#3 Rx as well.

## 2018-04-18 ENCOUNTER — Ambulatory Visit (INDEPENDENT_AMBULATORY_CARE_PROVIDER_SITE_OTHER): Payer: Medicaid Other | Admitting: Orthopedic Surgery

## 2018-05-24 ENCOUNTER — Encounter (HOSPITAL_COMMUNITY): Payer: Self-pay | Admitting: Emergency Medicine

## 2018-05-24 ENCOUNTER — Emergency Department (HOSPITAL_COMMUNITY)
Admission: EM | Admit: 2018-05-24 | Discharge: 2018-05-24 | Disposition: A | Discharge status: Home / Self Care | Payer: Medicaid Other | Attending: Emergency Medicine | Admitting: Emergency Medicine

## 2018-05-24 ENCOUNTER — Other Ambulatory Visit: Payer: Self-pay

## 2018-05-24 DIAGNOSIS — Y990 Civilian activity done for income or pay: Secondary | ICD-10-CM | POA: Insufficient documentation

## 2018-05-24 DIAGNOSIS — Y939 Activity, unspecified: Secondary | ICD-10-CM | POA: Diagnosis not present

## 2018-05-24 DIAGNOSIS — T22121A Burn of first degree of right elbow, initial encounter: Secondary | ICD-10-CM

## 2018-05-24 DIAGNOSIS — X153XXA Contact with hot saucepan or skillet, initial encounter: Secondary | ICD-10-CM | POA: Diagnosis not present

## 2018-05-24 DIAGNOSIS — T23152A Burn of first degree of left palm, initial encounter: Secondary | ICD-10-CM | POA: Diagnosis not present

## 2018-05-24 DIAGNOSIS — T22122A Burn of first degree of left elbow, initial encounter: Secondary | ICD-10-CM | POA: Diagnosis not present

## 2018-05-24 DIAGNOSIS — T23102A Burn of first degree of left hand, unspecified site, initial encounter: Secondary | ICD-10-CM

## 2018-05-24 DIAGNOSIS — Y929 Unspecified place or not applicable: Secondary | ICD-10-CM | POA: Diagnosis not present

## 2018-05-24 DIAGNOSIS — T23002A Burn of unspecified degree of left hand, unspecified site, initial encounter: Secondary | ICD-10-CM | POA: Diagnosis present

## 2018-05-24 MED ORDER — BACITRACIN-NEOMYCIN-POLYMYXIN 400-5-5000 EX OINT
TOPICAL_OINTMENT | Freq: Once | CUTANEOUS | Status: AC
  Administered 2018-05-24: 1 via TOPICAL
  Filled 2018-05-24: qty 2
  Filled 2018-05-24: qty 14.17

## 2018-05-24 MED ORDER — ACETAMINOPHEN 500 MG PO TABS
1000.0000 mg | ORAL_TABLET | Freq: Once | ORAL | Status: AC
  Administered 2018-05-24: 1000 mg via ORAL
  Filled 2018-05-24: qty 2

## 2018-05-24 MED ORDER — IBUPROFEN 800 MG PO TABS
800.0000 mg | ORAL_TABLET | Freq: Once | ORAL | Status: AC
  Administered 2018-05-24: 800 mg via ORAL
  Filled 2018-05-24: qty 1

## 2018-05-24 NOTE — Discharge Instructions (Addendum)
Please use cool compresses to the left hand and left elbow burn area twice a day.  Please apply Neosporin and a dressing.  If the area should blister, please do not rupture the blisters.  Use 1000 mg of Tylenol and 600 mg of ibuprofen every 6 hours as needed for pain or discomfort.  Please see your physicians at the health department or return to the emergency department if any high fever, pus like drainage, increased redness, signs of advancing infection.

## 2018-05-24 NOTE — ED Provider Notes (Signed)
[facility] EMERGENCY DEPARTMENT Provider Note   CSN: [id number] Arrival date & time: 05/24/18  0820     History   Chief Complaint Chief Complaint  Patient presents with  . Burn    HPI Stephen Byrd is a 18 y.o. male.  The history is provided by the patient.  Burn  Burn location:  Hand and shoulder/arm Shoulder/arm burn location:  L elbow Hand burn location:  L hand Burn quality:  Red and painful Time since incident:  2 hours Progression:  Worsening Pain details:    Severity:  Moderate   Duration:  2 hours   Timing:  Constant   Progression:  Worsening Mechanism of burn: hkot metal pans. Incident location:  Work Relieved by:  Nothing Exacerbated by: palpation. Ineffective treatments:  None tried Associated symptoms: no cough and no shortness of breath   Tetanus status:  Up to date   Past Medical History:  Diagnosis Date  . ADHD (attention deficit hyperactivity disorder)   . Bipolar 1 disorder (HCC)   . Heart murmur     There are no active problems to display for this patient.   Past Surgical History:  Procedure Laterality Date  . ACROMIO-CLAVICULAR JOINT REPAIR Left 02/13/2018   Procedure: LEFT SHOULDER ACROMIOCLAVICULAR RECONSTRUCTION;  Surgeon: Cammy Copa, MD;  Location: [facility] OR;  Service: Orthopedics;  Laterality: Left;  . NO PAST SURGERIES          Home Medications    Prior to Admission medications   Medication Sig Start Date End Date Taking? Authorizing Provider  acetaminophen-codeine (TYLENOL #3) 300-30 MG tablet Take 1 tablet by mouth every 8 (eight) hours as needed for moderate pain. 04/10/18   Cammy Copa, MD  benzonatate (TESSALON) 100 MG capsule Take 1 capsule (100 mg total) by mouth every 8 (eight) hours. 04/06/18   Janne Napoleon, NP  HYDROcodone-acetaminophen (NORCO/VICODIN) 5-325 MG tablet 1 po q 6-8 hrs prn pain 02/21/18   Cammy Copa, MD  oseltamivir (TAMIFLU) 75 MG capsule Take 1 capsule (75 mg total) by mouth  every 12 (twelve) hours. 04/06/18   Janne Napoleon, NP    Family History Family History  Problem Relation Age of Onset  . Hypertension Mother   . Hypertension Maternal Grandfather     Social History Social History   Tobacco Use  . Smoking status: Never Smoker  . Smokeless tobacco: Never Used  Substance Use Topics  . Alcohol use: No  . Drug use: Yes    Frequency: 2.0 times per week    Types: Marijuana    Comment: last use yesterday      Allergies   Patient has no known allergies.   Review of Systems Review of Systems  Constitutional: Negative for activity change.       All ROS Neg except as noted in HPI  HENT: Negative for nosebleeds.   Eyes: Negative for photophobia and discharge.  Respiratory: Negative for cough, shortness of breath and wheezing.   Cardiovascular: Negative for chest pain and palpitations.  Gastrointestinal: Negative for abdominal pain and blood in stool.  Genitourinary: Negative for dysuria, frequency and hematuria.  Musculoskeletal: Negative for arthralgias, back pain and neck pain.  Skin: Negative.   Neurological: Negative for dizziness, seizures and speech difficulty.  Psychiatric/Behavioral: Negative for confusion and hallucinations.     Physical Exam Updated Vital Signs BP 128/71 (BP Location: Right Arm)   Pulse (!) 52   Temp 97.7 F (36.5 C) (Oral)   Resp  16   Ht 5\' 7"  (1.702 m)   Wt 79.4 kg   SpO2 100%   BMI 27.41 kg/m   Physical Exam Vitals signs and nursing note reviewed.  Constitutional:      Appearance: He is well-developed. He is not toxic-appearing.  HENT:     Head: Normocephalic.     Right Ear: Tympanic membrane and external ear normal.     Left Ear: Tympanic membrane and external ear normal.  Eyes:     General: Lids are normal.     Pupils: Pupils are equal, round, and reactive to light.  Neck:     Musculoskeletal: Normal range of motion and neck supple.     Vascular: No carotid bruit.  Cardiovascular:     Rate  and Rhythm: Normal rate and regular rhythm.     Pulses: Normal pulses.     Heart sounds: Normal heart sounds.  Pulmonary:     Effort: No respiratory distress.     Breath sounds: Normal breath sounds.  Abdominal:     General: Bowel sounds are normal.     Palpations: Abdomen is soft.     Tenderness: There is no abdominal tenderness. There is no guarding.  Musculoskeletal: Normal range of motion.     Left elbow: Tenderness found.       Arms:     Left hand: He exhibits tenderness.       Hands:  Lymphadenopathy:     Head:     Right side of head: No submandibular adenopathy.     Left side of head: No submandibular adenopathy.     Cervical: No cervical adenopathy.  Skin:    General: Skin is warm and dry.  Neurological:     Mental Status: He is alert and oriented to person, place, and time.     Cranial Nerves: No cranial nerve deficit.     Sensory: No sensory deficit.  Psychiatric:        Speech: Speech normal.      ED Treatments / Results  Labs (all labs ordered are listed, but only abnormal results are displayed) Labs Reviewed - No data to display  EKG None  Radiology No results found.  Procedures Procedures (including critical care time)  Medications Ordered in ED Medications - No data to display   Initial Impression / Assessment and Plan / ED Course  I have reviewed the triage vital signs and the nursing notes.  Pertinent labs & imaging results that were available during my care of the patient were reviewed by me and considered in my medical decision making (see chart for details).       Final Clinical Impressions(s) / ED Diagnoses MDm  Vital signs reviewed.  Pulse oximetry is 100% on room air.  Within normal limits by my interpretation.  Patient sustained a first-degree burn on the palmar surface of the left hand, as well as the elbow/forearm area on the left.  Patient has full range of motion of the right and left upper extremities.  Tetanus is  up-to-date.  Patient will be treated with bacitracin or Neosporin ointment dressing as well as Tylenol and ibuprofen for soreness.  Patient to return to the emergency department if any changes in condition, problems, or concerns.   Final diagnoses:  Superficial burn of left hand, unspecified site of hand, initial encounter  Superficial burn of right elbow, initial encounter    ED Discharge Orders    None       Ivery QualeBryant, Angie Hogg, PA-C  05/24/18 2116    Maia PlanLong, Joshua G, MD 05/26/18 [phone]

## 2018-05-24 NOTE — ED Triage Notes (Signed)
Pt reports burning LT posterior forearm and LT hand approx 1 hr ago on hot metal pans at work. No blistering noted at this time.

## 2018-10-19 ENCOUNTER — Other Ambulatory Visit: Payer: Self-pay

## 2018-10-19 ENCOUNTER — Emergency Department (HOSPITAL_COMMUNITY)
Admission: EM | Admit: 2018-10-19 | Discharge: 2018-10-19 | Disposition: A | Discharge status: Home / Self Care | Payer: Medicaid Other | Attending: Emergency Medicine | Admitting: Emergency Medicine

## 2018-10-19 ENCOUNTER — Encounter (HOSPITAL_COMMUNITY): Payer: Self-pay | Admitting: Emergency Medicine

## 2018-10-19 DIAGNOSIS — Z5321 Procedure and treatment not carried out due to patient leaving prior to being seen by health care provider: Secondary | ICD-10-CM | POA: Insufficient documentation

## 2018-10-19 DIAGNOSIS — R42 Dizziness and giddiness: Secondary | ICD-10-CM | POA: Diagnosis not present

## 2018-10-19 DIAGNOSIS — R51 Headache: Secondary | ICD-10-CM | POA: Insufficient documentation

## 2018-10-19 DIAGNOSIS — R110 Nausea: Secondary | ICD-10-CM | POA: Diagnosis not present

## 2018-10-19 DIAGNOSIS — R0602 Shortness of breath: Secondary | ICD-10-CM | POA: Diagnosis not present

## 2018-10-19 DIAGNOSIS — F1290 Cannabis use, unspecified, uncomplicated: Secondary | ICD-10-CM | POA: Insufficient documentation

## 2018-10-19 NOTE — ED Triage Notes (Signed)
Pt c/o headache that started x 3 hours ago. States he took "a white pill with red letters" and went to sleep, but woke up with the headache worse, dizzy, nauseous, and SOB. NAD at this time

## 2018-10-19 NOTE — ED Notes (Signed)
Pt admits that he smoke "weed" about 3 hours ago. Pt currently texting on his phone, no distress noted at this time.

## 2018-10-19 NOTE — ED Notes (Signed)
Pt states he is only here to been seen for his headache. Explained to pt that provider would be in to see him as soon as they could. Pt states that he is not going to wait any longer and that he is leaving. Pt ambulatory with no distress at this time.

## 2018-12-30 ENCOUNTER — Encounter (HOSPITAL_COMMUNITY): Payer: Self-pay | Admitting: Emergency Medicine

## 2018-12-30 ENCOUNTER — Other Ambulatory Visit: Payer: Self-pay

## 2018-12-30 ENCOUNTER — Emergency Department (HOSPITAL_COMMUNITY)
Admission: EM | Admit: 2018-12-30 | Discharge: 2018-12-30 | Disposition: A | Discharge status: Home / Self Care | Payer: Medicaid Other | Attending: Emergency Medicine | Admitting: Emergency Medicine

## 2018-12-30 ENCOUNTER — Emergency Department (HOSPITAL_COMMUNITY): Payer: Medicaid Other

## 2018-12-30 DIAGNOSIS — M25512 Pain in left shoulder: Secondary | ICD-10-CM | POA: Diagnosis present

## 2018-12-30 DIAGNOSIS — F909 Attention-deficit hyperactivity disorder, unspecified type: Secondary | ICD-10-CM | POA: Insufficient documentation

## 2018-12-30 MED ORDER — LIDOCAINE 5 % EX PTCH: 1.0000 | MEDICATED_PATCH | CUTANEOUS | 0 refills | Status: DC

## 2018-12-30 MED ORDER — NAPROXEN 500 MG PO TABS: 500.0000 mg | ORAL_TABLET | Freq: Two times a day (BID) | ORAL | 0 refills | Status: DC

## 2018-12-30 MED ORDER — NAPROXEN 250 MG PO TABS
500.0000 mg | ORAL_TABLET | Freq: Once | ORAL | Status: AC
  Administered 2018-12-30: 14:00:00 500 mg via ORAL
  Filled 2018-12-30: qty 2

## 2018-12-30 NOTE — Discharge Instructions (Signed)
Please read and follow all provided instructions.  You have been seen today for a left shoulder injury. Your x-ray did not show any substantial abnormalities. We have placed you in a sling for comfort, be sure to still move the left shoulder some to avoid frozen shoulder as we discussed.   Tests performed today include: An x-ray of the affected area - does NOT show any broken bones or dislocations.  Vital signs. See below for your results today.   Home care instructions: -- *PRICE in the first 24-48 hours after injury: Protect (with brace, splint, sling), if given by your provider Rest Ice- Do not apply ice pack directly to your skin, place towel or similar between your skin and ice/ice pack. Apply ice for 20 min, then remove for 40 min while awake Compression- Wear brace, elastic bandage, splint as directed by your provider Elevate - not applicable  Medications:  - Lidoderm patch- this is a topical patch to place directly over the left shoulder daily to help soothe the pain.  - Naproxen is a nonsteroidal anti-inflammatory medication that will help with pain and swelling. Be sure to take this medication as prescribed with food, 1 pill every 12 hours,  It should be taken with food, as it can cause stomach upset, and more seriously, stomach bleeding. Do not take other nonsteroidal anti-inflammatory medications with this such as Advil, Motrin, Aleve, Mobic, Goodie Powder, or Motrin.    You make take Tylenol per over the counter dosing with these medications.   We have prescribed you new medication(s) today. Discuss the medications prescribed today with your pharmacist as they can have adverse effects and interactions with your other medicines including over the counter and prescribed medications. Seek medical evaluation if you start to experience new or abnormal symptoms after taking one of these medicines, seek care immediately if you start to experience difficulty breathing, feeling of your  throat closing, facial swelling, or rash as these could be indications of a more serious allergic reaction   Follow-up instructions: Please follow-up with your orthopedic physician (bone specialist) if you continue to have significant pain in 1 week. In this case you may have a more severe injury that requires further care.   Return instructions:  Please return if your digits or extremity are numb or tingling, appear gray or blue, or you have severe pain (also elevate the extremity and loosen splint or wrap if you were given one) Please return if you have redness or fevers.  Please return to the Emergency Department if you experience worsening symptoms.  Please return if you have any other emergent concerns. Additional Information:  Your vital signs today were: BP 110/82    Pulse 68    Temp 97.8 F (36.6 C) (Oral)    Resp 16    Ht 5\' 6"  (1.676 m)    Wt 73.9 kg    SpO2 98%    BMI 26.31 kg/m  If your blood pressure (BP) was elevated above 135/85 this visit, please have this repeated by your doctor within one month. ---------------

## 2018-12-30 NOTE — ED Provider Notes (Signed)
[facility] EMERGENCY DEPARTMENT Provider Note   CSN: [id number] Arrival date & time: 12/30/18  1300     History   Chief Complaint Chief Complaint  Patient presents with  . Shoulder Pain    HPI Stephen Byrd is a 18 y.o. male with a hx of ADHD, bipolar 1 disorder, & [facility] who presents to the ED w/ complaints of acute on chronic L shoulder pain. Patient reports that he has had issues with pain in his left shoulder since his surgical ACL repair last year.  2 days prior his parents gave him Advil and a "pain pill" which he believes was oxycodone, this made him sleepy while he was lying on the couch therefore he decided to take a nap.  He states when he woke up he was rolling off of the couch and fell directly onto the left shoulder.  He denies head injury or loss of consciousness.  He did not have a syncopal episode.  He simply rolled over while sleeping.  He states the injury has increased pain in his left shoulder.  He rates this a 10 out of 10 in severity, worse with movement, no alleviating factors.  Denies numbness, tingling, weakness, or other areas of injury.  Denies fever, chills, or redness.    HPI  Past Medical History:  Diagnosis Date  . ADHD (attention deficit hyperactivity disorder)   . Bipolar 1 disorder (HCC)   . Heart murmur     There are no active problems to display for this patient.   Past Surgical History:  Procedure Laterality Date  . ACROMIO-CLAVICULAR JOINT REPAIR Left 02/13/2018   Procedure: LEFT SHOULDER ACROMIOCLAVICULAR RECONSTRUCTION;  Surgeon: Cammy Copaean, Gregory Scott, MD;  [facility];  Service: Orthopedics;  Laterality: Left;  . NO PAST SURGERIES          Home Medications    Prior to Admission medications   Medication Sig Start Date End Date Taking? Authorizing Provider  acetaminophen-codeine (TYLENOL #3) 300-30 MG tablet Take 1 tablet by mouth every 8 (eight) hours as needed for moderate pain. 04/10/18    Cammy Copaean, Gregory Scott, MD  benzonatate (TESSALON) 100 MG capsule Take 1 capsule (100 mg total) by mouth every 8 (eight) hours. 04/06/18   Janne NapoleonNeese, Hope M, NP  HYDROcodone-acetaminophen (NORCO/VICODIN) 5-325 MG tablet 1 po q 6-8 hrs prn pain 02/21/18   Cammy Copaean, Gregory Scott, MD  oseltamivir (TAMIFLU) 75 MG capsule Take 1 capsule (75 mg total) by mouth every 12 (twelve) hours. 04/06/18   Janne NapoleonNeese, Hope M, NP    Family History Family History  Problem Relation Age of Onset  . Hypertension Mother   . Hypertension Maternal Grandfather     Social History Social History   Tobacco Use  . Smoking status: Never Smoker  . Smokeless tobacco: Never Used  Substance Use Topics  . Alcohol use: No  . Drug use: Yes    Types: Marijuana     Allergies   Patient has no known allergies.   Review of Systems Review of Systems  Constitutional: Negative for chills and fever.  Eyes: Negative for visual disturbance.  Gastrointestinal: Negative for vomiting.  Musculoskeletal: Positive for arthralgias. Negative for back pain and neck pain.  Skin: Negative for color change and wound.  Neurological: Negative for dizziness, syncope, speech difficulty, weakness, light-headedness, numbness and headaches.     Physical Exam Updated Vital Signs BP 108/68 (BP Location: Right Arm)   Pulse 81  Temp 97.8 F (36.6 C) (Oral)   Resp 18   Ht 5\' 6"  (1.676 m)   Wt 73.9 kg   SpO2 100%   BMI 26.31 kg/m   Physical Exam Vitals signs and nursing note reviewed.  Constitutional:      General: He is not in acute distress.    Appearance: Normal appearance. He is well-developed. He is not ill-appearing or toxic-appearing.  HENT:     Head: Normocephalic and atraumatic.  Eyes:     General:        Right eye: No discharge.        Left eye: No discharge.     Conjunctiva/sclera: Conjunctivae normal.  Neck:     Musculoskeletal: Normal range of motion and neck supple.     Comments: No midline tenderness.  Cardiovascular:      Rate and Rhythm: Normal rate and regular rhythm.     Pulses:          Radial pulses are 2+ on the right side and 2+ on the left side.  Pulmonary:     Effort: Pulmonary effort is normal. No respiratory distress.     Breath sounds: Normal breath sounds. No wheezing, rhonchi or rales.  Chest:     Chest wall: No tenderness.  Abdominal:     General: There is no distension.     Palpations: Abdomen is soft.     Tenderness: There is no abdominal tenderness.  Musculoskeletal:     Comments: Upper extremities: L shoulder surgical scar well healed. No obvious deformity, appreciable swelling, edema, erythema, ecchymosis, warmth, or open wounds. Patient has intact AROM throughout the UEs with the exception of limitation in L shoulder flexion/abduction- able to get to about 90 degrees. Tender to palpation diffusely over the left glenohumeral joint as well as the sits muscles.  Patient unable to tolerate special test.  Upper extremities are otherwise nontender Back: No midline tenderness Lower extremities: Normal range of motion, nontender.  Skin:    General: Skin is warm and dry.     Capillary Refill: Capillary refill takes less than 2 seconds.     Findings: No rash.  Neurological:     Mental Status: He is alert.     Comments: Alert. Clear speech. Sensation grossly intact to bilateral upper extremities. 5/5 symmetric grip strength. Ambulatory.  Able to perform okay sign, thumbs up, and cross second/third digits bilaterally.  Psychiatric:        Mood and Affect: Mood normal.        Behavior: Behavior normal.    ED Treatments / Results  Labs (all labs ordered are listed, but only abnormal results are displayed) Labs Reviewed - No data to display  EKG None  Radiology Dg Shoulder Left  Result Date: 12/30/2018 CLINICAL DATA:  Left shoulder pain after fall from bed. Prior shoulder surgery 02/13/2018. EXAM: LEFT SHOULDER - 2+ VIEW COMPARISON:  Multiple exams, including 02/21/2018 FINDINGS: The Top  Hat and FlipButton components of the coracoclavicular ligament reconstruction are stable in location with the former along the upper margin of the distal clavicle and the latter immediately subcoracoid. Stable spurring of the distal clavicle with reasonable AC joint alignment. Subacromial morphology is type 2 (curved). No appreciable fracture or acute bony findings. IMPRESSION: 1. Stable positioning of the hardware elements of the coracoclavicular ligament reconstruction. AC joint alignment remains normal. 2. Stable spurring of the distal clavicle. 3. No fracture or dislocation. Electronically Signed   By: Van Clines M.D.   On:  12/30/2018 13:45    Procedures Procedures (including critical care time)  SPLINT APPLICATION Date/Time: 2:20 PM Authorized by: Harvie Heck Consent: Verbal consent obtained. Risks and benefits: risks, benefits and alternatives were discussed Consent given by: patient Splint applied by: RN Location details: LUE Splint type: sling Supplies used: sling Post-procedure: The splinted body part was neurovascularly unchanged following the procedure. Patient tolerance: Patient tolerated the procedure well with no immediate complications.   Medications Ordered in ED Medications - No data to display   Initial Impression / Assessment and Plan / ED Course  I have reviewed the triage vital signs and the nursing notes.  Pertinent labs & imaging results that were available during my care of the patient were reviewed by me and considered in my medical decision making (see chart for details).   Patient presents to the emergency department with complaints of acute on chronic left shoulder pain after he fell off the couch when he rolled over while taking a nap.  Nontoxic-appearing, no apparent distress, vitals WNL.  No signs of serious head, neck, back, or intrathoracic/abdominal injury related to fall.  Seems to have isolated pain to the left shoulder.  There is no  erythema/warmth/fevers to suggest septic joint.  His range of motion is mildly limited with left shoulder flexion/abduction.  He is diffusely tender to palpation throughout the glenohumeral joint and the SITS muscles.  X-ray obtained reveals hardware intact, no fractures or dislocations.  Neurovascular intact distally.  Will place patient in sling, discussed maintaining some movement about the shoulder to avoid adhesive capsulitis.  Will prescribe naproxen and Lidoderm patches for pain control and have him follow-up closely with his orthopedic surgeon. I discussed results, treatment plan, need for follow-up, and return precautions with the patient and parent at bedside. Provided opportunity for questions, patient and parent confirmed understanding and are in agreement with plan.   Final Clinical Impressions(s) / ED Diagnoses   Final diagnoses:  Left shoulder pain, unspecified chronicity    ED Discharge Orders         Ordered    naproxen (NAPROSYN) 500 MG tablet  2 times daily     12/30/18 1415    lidocaine (LIDODERM) 5 %  Every 24 hours,   Status:  Discontinued     12/30/18 1415    lidocaine (LIDODERM) 5 %  Every 24 hours     12/30/18 1417           Landon Truax, [place] R, PA-C 12/30/18 1421    Samuel Jester, DO 01/03/19 1543
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
## 2019-03-16 ENCOUNTER — Emergency Department (HOSPITAL_COMMUNITY)
Admission: EM | Admit: 2019-03-16 | Discharge: 2019-03-16 | Disposition: A | Discharge status: Home / Self Care | Payer: Medicaid Other | Attending: Emergency Medicine | Admitting: Emergency Medicine

## 2019-03-16 ENCOUNTER — Encounter (HOSPITAL_COMMUNITY): Payer: Self-pay | Admitting: Emergency Medicine

## 2019-03-16 ENCOUNTER — Other Ambulatory Visit: Payer: Self-pay

## 2019-03-16 ENCOUNTER — Emergency Department (HOSPITAL_COMMUNITY): Payer: Medicaid Other

## 2019-03-16 DIAGNOSIS — Y93I9 Activity, other involving external motion: Secondary | ICD-10-CM | POA: Diagnosis not present

## 2019-03-16 DIAGNOSIS — Y92410 Unspecified street and highway as the place of occurrence of the external cause: Secondary | ICD-10-CM | POA: Diagnosis not present

## 2019-03-16 DIAGNOSIS — Y999 Unspecified external cause status: Secondary | ICD-10-CM | POA: Insufficient documentation

## 2019-03-16 DIAGNOSIS — Z791 Long term (current) use of non-steroidal anti-inflammatories (NSAID): Secondary | ICD-10-CM | POA: Insufficient documentation

## 2019-03-16 DIAGNOSIS — M25512 Pain in left shoulder: Secondary | ICD-10-CM | POA: Diagnosis not present

## 2019-03-16 DIAGNOSIS — M25572 Pain in left ankle and joints of left foot: Secondary | ICD-10-CM | POA: Insufficient documentation

## 2019-03-16 MED ORDER — METHOCARBAMOL 500 MG PO TABS: 500.0000 mg | ORAL_TABLET | Freq: Three times a day (TID) | ORAL | 0 refills | Status: DC | PRN

## 2019-03-16 MED ORDER — HYDROCODONE-ACETAMINOPHEN 5-325 MG PO TABS
1.0000 | ORAL_TABLET | Freq: Once | ORAL | Status: AC
  Administered 2019-03-16: 1 via ORAL
  Filled 2019-03-16: qty 1

## 2019-03-16 MED ORDER — NAPROXEN 500 MG PO TABS: 500.0000 mg | ORAL_TABLET | Freq: Two times a day (BID) | ORAL | 0 refills | Status: DC

## 2019-03-16 NOTE — ED Notes (Signed)
From rad 

## 2019-03-16 NOTE — ED Notes (Signed)
NAD speaking on cell phone  To rad
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
## 2019-03-16 NOTE — ED Notes (Signed)
SP, PA in to discuss findings  
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
## 2019-05-02 ENCOUNTER — Encounter (HOSPITAL_COMMUNITY): Payer: Self-pay | Admitting: Emergency Medicine

## 2019-05-02 ENCOUNTER — Emergency Department (HOSPITAL_COMMUNITY)
Admission: EM | Admit: 2019-05-02 | Discharge: 2019-05-02 | Disposition: A | Discharge status: Home / Self Care | Payer: Medicaid Other | Attending: Emergency Medicine | Admitting: Emergency Medicine

## 2019-05-02 ENCOUNTER — Other Ambulatory Visit: Payer: Self-pay

## 2019-05-02 ENCOUNTER — Emergency Department (HOSPITAL_COMMUNITY): Payer: Medicaid Other

## 2019-05-02 DIAGNOSIS — M79645 Pain in left finger(s): Secondary | ICD-10-CM

## 2019-05-02 DIAGNOSIS — M79642 Pain in left hand: Secondary | ICD-10-CM | POA: Diagnosis not present

## 2019-05-02 DIAGNOSIS — Z79899 Other long term (current) drug therapy: Secondary | ICD-10-CM | POA: Insufficient documentation

## 2019-05-02 MED ORDER — IBUPROFEN 800 MG PO TABS: 800.0000 mg | ORAL_TABLET | Freq: Four times a day (QID) | ORAL | 0 refills | Status: DC | PRN

## 2019-05-02 MED ORDER — CEFTRIAXONE SODIUM 1 G IJ SOLR
1.0000 g | Freq: Once | INTRAMUSCULAR | Status: AC
  Administered 2019-05-02: 08:00:00 1 g via INTRAMUSCULAR
  Filled 2019-05-02: qty 10

## 2019-05-02 MED ORDER — IBUPROFEN 800 MG PO TABS
800.0000 mg | ORAL_TABLET | Freq: Once | ORAL | Status: AC
  Administered 2019-05-02: 800 mg via ORAL
  Filled 2019-05-02: qty 1

## 2019-05-02 MED ORDER — LIDOCAINE HCL (PF) 1 % IJ SOLN
INTRAMUSCULAR | Status: AC
  Filled 2019-05-02: qty 2

## 2019-05-02 MED ORDER — DOXYCYCLINE HYCLATE 100 MG PO TABS
100.0000 mg | ORAL_TABLET | Freq: Once | ORAL | Status: AC
  Administered 2019-05-02: 100 mg via ORAL
  Filled 2019-05-02: qty 1

## 2019-05-02 MED ORDER — DOXYCYCLINE HYCLATE 100 MG PO CAPS: 100.0000 mg | ORAL_CAPSULE | Freq: Two times a day (BID) | ORAL | 0 refills | Status: DC

## 2019-05-02 MED ORDER — ACETAMINOPHEN 500 MG PO TABS
1000.0000 mg | ORAL_TABLET | Freq: Once | ORAL | Status: AC
  Administered 2019-05-02: 1000 mg via ORAL
  Filled 2019-05-02: qty 2

## 2019-05-02 NOTE — ED Provider Notes (Signed)
[facility] EMERGENCY DEPARTMENT Provider Note   CSN: [id number] Arrival date & time: 05/02/19  [id number]     History Chief Complaint  Patient presents with  . Hand Pain    Stephen Byrd is a 18 y.o. male.  Patient presents with pain and swelling of left middle finger.  He reports that over the course of yesterday started to have pain and swelling to the point where he cannot straighten his finger out now.  He denies any injury.        Past Medical History:  Diagnosis Date  . ADHD (attention deficit hyperactivity disorder)   . Bipolar 1 disorder (HCC)   . Heart murmur     There are no problems to display for this patient.   Past Surgical History:  Procedure Laterality Date  . ACROMIO-CLAVICULAR JOINT REPAIR Left 02/13/2018   Procedure: LEFT SHOULDER ACROMIOCLAVICULAR RECONSTRUCTION;  Surgeon: Cammy Copaean, Gregory Scott, MD;  [facility];  Service: Orthopedics;  Laterality: Left;  . NO PAST SURGERIES         Family History  Problem Relation Age of Onset  . Hypertension Mother   . Hypertension Maternal Grandfather     Social History   Tobacco Use  . Smoking status: Never Smoker  . Smokeless tobacco: Never Used  Substance Use Topics  . Alcohol use: No  . Drug use: Yes    Types: Marijuana    Home Medications Prior to Admission medications   Medication Sig Start Date End Date Taking? Authorizing Provider  acetaminophen-codeine (TYLENOL #3) 300-30 MG tablet Take 1 tablet by mouth every 8 (eight) hours as needed for moderate pain. 04/10/18   Cammy Copaean, Gregory Scott, MD  benzonatate (TESSALON) 100 MG capsule Take 1 capsule (100 mg total) by mouth every 8 (eight) hours. 04/06/18   Janne NapoleonNeese, Hope M, NP  doxycycline (VIBRAMYCIN) 100 MG capsule Take 1 capsule (100 mg total) by mouth 2 (two) times daily. 05/02/19   Gilda CreasePollina, Shriley Joffe J, MD  HYDROcodone-acetaminophen (NORCO/VICODIN) 5-325 MG tablet 1 po q 6-8 hrs prn pain 02/21/18   Cammy Copaean, Gregory Scott, MD  ibuprofen (ADVIL)  800 MG tablet Take 1 tablet (800 mg total) by mouth every 6 (six) hours as needed for moderate pain. 05/02/19   Gilda CreasePollina, Anniece Bleiler J, MD  lidocaine (LIDODERM) 5 % Place 1 patch onto the skin daily. Apply patch directly to left shoulder; Remove & Discard patch within 12 hours 12/30/18   Petrucelli, Samantha R, PA-C  methocarbamol (ROBAXIN) 500 MG tablet Take 1 tablet (500 mg total) by mouth every 8 (eight) hours as needed for muscle spasms. 03/16/19   Petrucelli, Samantha R, PA-C  naproxen (NAPROSYN) 500 MG tablet Take 1 tablet (500 mg total) by mouth 2 (two) times daily. 03/16/19   Petrucelli, Pleas KochSamantha R, PA-C  oseltamivir (TAMIFLU) 75 MG capsule Take 1 capsule (75 mg total) by mouth every 12 (twelve) hours. 04/06/18   Janne NapoleonNeese, Hope M, NP    Allergies    Patient has no known allergies.  Review of Systems   Review of Systems  Constitutional: Negative for fever.  Musculoskeletal: Positive for arthralgias.    Physical Exam Updated Vital Signs BP (!) 131/91 (BP Location: Left Arm)   Pulse (!) 46   Temp 98.2 F (36.8 C) (Oral)   Resp 18   SpO2 100%   Physical Exam Vitals and nursing note reviewed.  Constitutional:      Appearance: Normal appearance.  HENT:     Head: Normocephalic and atraumatic.  Pulmonary:  Effort: Pulmonary effort is normal.  Musculoskeletal:     Left hand: Swelling (PIP middle finger) and tenderness (PIP middle finger) present. Decreased range of motion (PIP middle finger). Normal sensation. There is no disruption of two-point discrimination. Normal capillary refill.     Cervical back: Normal range of motion and neck supple.  Skin:    Findings: No erythema, rash or wound.  Neurological:     Mental Status: He is alert.     Sensory: Sensation is intact.     Motor: Motor function is intact.     ED Results / Procedures / Treatments   Labs (all labs ordered are listed, but only abnormal results are displayed) Labs Reviewed - No data to  display  EKG None  Radiology DG Hand Complete Left  Result Date: 05/02/2019 CLINICAL DATA:  Pain. Sudden onset of left middle finger pain radiating into the metacarpal. Cannot extend middle finger, fixed in flexed position. No recent injury. EXAM: LEFT HAND - COMPLETE 3+ VIEW COMPARISON:  Hand radiograph 11/16/2017 FINDINGS: There is no evidence of acute or healed fracture. The third digit is held in flexion at the proximal interphalangeal joint on all views. There is no evidence of arthropathy or other focal bone abnormality. Soft tissues are unremarkable. IMPRESSION: The third digit is held in flexion at the proximal interphalangeal joint on all views. Otherwise unremarkable radiographs of the left hand. Electronically Signed   By: Keith Rake M.D.   On: 05/02/2019 06:43    Procedures Procedures (including critical care time)  Medications Ordered in ED Medications  cefTRIAXone (ROCEPHIN) injection 1 g (has no administration in time range)  doxycycline (VIBRA-TABS) tablet 100 mg (has no administration in time range)    ED Course  I have reviewed the triage vital signs and the nursing notes.  Pertinent labs & imaging results that were available during my care of the patient were reviewed by me and considered in my medical decision making (see chart for details).    MDM Rules/Calculators/A&P                      Patient presents to the emergency department for evaluation of pain of the left middle finger.  He reports that he injured it about a year ago but has not had any recent injuries.  Patient noted to have the finger flexed at the PIP joint.  He has pain with extension of the finger.  There is no redness or erythema.  No warmth.  He appears to have some swelling around the joint itself, but no proximal swelling.  There is no swelling over the flexor surface of the finger.  There are no overlying wounds.  Although he is holding the finger in flexion, remaining signs of Knavel  are negative.  Examination of the joint itself does not suggest joint infection.  Etiology is unclear.  Examination does not suggest infection at this time.  I will empirically treat him with Rocephin and doxycycline as I cannot rule out infection.  This should cover for gonorrhea as well as additional bacterial infection.  Patient will be referred to hand surgery.  He was counseled on findings of worsening symptoms that would include findings of septic arthritis and tenosynovitis.  Patient counseled return immediately to the ER Final Clinical Impression(s) / ED Diagnoses Final diagnoses:  Finger pain, left    Rx / DC Orders ED Discharge Orders         Ordered    ibuprofen (  ADVIL) 800 MG tablet  Every 6 hours PRN     05/02/19 0709    doxycycline (VIBRAMYCIN) 100 MG capsule  2 times daily     05/02/19 0709           Gilda Crease, MD 05/02/19 [phone]

## 2019-05-02 NOTE — Discharge Instructions (Signed)
If you with a fever, worsening pain or swelling, redness of the joint or finger, redness, swelling worse pain in the hand return to the ER immediately for repeat evaluation.

## 2019-05-02 NOTE — ED Notes (Signed)
ED Provider at bedside. 

## 2019-05-02 NOTE — ED Notes (Signed)
Pt signed, but signature pad did not work.

## 2019-05-02 NOTE — ED Triage Notes (Signed)
Pt C/o left knuckle and left middle finger pain. Left finger swollen and unable to bend.

## 2019-05-22 ENCOUNTER — Telehealth: Payer: Self-pay

## 2019-05-22 NOTE — Telephone Encounter (Signed)

## 2019-05-23 ENCOUNTER — Institutional Professional Consult (permissible substitution): Payer: Medicaid Other | Admitting: Plastic Surgery

## 2019-06-04 ENCOUNTER — Other Ambulatory Visit: Payer: Self-pay

## 2019-06-04 ENCOUNTER — Emergency Department (HOSPITAL_COMMUNITY)
Admission: EM | Admit: 2019-06-04 | Discharge: 2019-06-04 | Disposition: A | Discharge status: Home / Self Care | Payer: Medicaid Other | Attending: Emergency Medicine | Admitting: Emergency Medicine

## 2019-06-04 ENCOUNTER — Encounter (HOSPITAL_COMMUNITY): Payer: Self-pay | Admitting: Emergency Medicine

## 2019-06-04 DIAGNOSIS — Z79899 Other long term (current) drug therapy: Secondary | ICD-10-CM | POA: Diagnosis not present

## 2019-06-04 DIAGNOSIS — R369 Urethral discharge, unspecified: Secondary | ICD-10-CM | POA: Diagnosis present

## 2019-06-04 DIAGNOSIS — A64 Unspecified sexually transmitted disease: Secondary | ICD-10-CM | POA: Diagnosis not present

## 2019-06-04 LAB — URINALYSIS, ROUTINE W REFLEX MICROSCOPIC
Bacteria, UA: NONE SEEN
Bilirubin Urine: NEGATIVE
Glucose, UA: NEGATIVE mg/dL
Ketones, ur: NEGATIVE mg/dL
Nitrite: NEGATIVE
Protein, ur: 100 mg/dL — AB
RBC / HPF: >50 RBC/hpf — ABNORMAL HIGH (ref 0–5)
Specific Gravity, Urine: 1.026 (ref 1.005–1.030)
WBC, UA: >50 WBC/hpf — ABNORMAL HIGH (ref 0–5)
pH: 6 (ref 5.0–8.0)

## 2019-06-04 MED ORDER — CEFTRIAXONE SODIUM 500 MG IJ SOLR
500.0000 mg | Freq: Once | INTRAMUSCULAR | Status: AC
  Administered 2019-06-04: 06:00:00 500 mg via INTRAMUSCULAR
  Filled 2019-06-04: qty 500

## 2019-06-04 MED ORDER — LIDOCAINE HCL (PF) 1 % IJ SOLN
INTRAMUSCULAR | Status: AC
  Filled 2019-06-04: qty 2
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
## 2019-06-04 NOTE — ED Triage Notes (Signed)
Pt c/o yellow-greenish discharge from penis x 2 weeks.

## 2019-06-04 NOTE — Discharge Instructions (Addendum)
You need to let all of your sexual contacts know that you have an STD, most likely gonorrhea, and they need to get evaluated.  Check in my chart for your test results in about 2 days, you will also be called if your test is positive.  Use condoms!

## 2019-06-05 LAB — GC/CHLAMYDIA PROBE AMP (~~LOC~~) NOT AT ARMC
Chlamydia: NEGATIVE
Neisseria Gonorrhea: POSITIVE — AB

## 2019-06-23 ENCOUNTER — Emergency Department (HOSPITAL_COMMUNITY): Payer: Medicaid Other

## 2019-06-23 ENCOUNTER — Encounter (HOSPITAL_COMMUNITY): Payer: Self-pay | Admitting: Emergency Medicine

## 2019-06-23 ENCOUNTER — Emergency Department (HOSPITAL_COMMUNITY)
Admission: EM | Admit: 2019-06-23 | Discharge: 2019-06-23 | Disposition: A | Discharge status: Home / Self Care | Payer: Medicaid Other | Attending: Emergency Medicine | Admitting: Emergency Medicine

## 2019-06-23 ENCOUNTER — Other Ambulatory Visit: Payer: Self-pay

## 2019-06-23 DIAGNOSIS — S99912A Unspecified injury of left ankle, initial encounter: Secondary | ICD-10-CM | POA: Diagnosis not present

## 2019-06-23 DIAGNOSIS — M25572 Pain in left ankle and joints of left foot: Secondary | ICD-10-CM | POA: Diagnosis not present

## 2019-06-23 DIAGNOSIS — Z79899 Other long term (current) drug therapy: Secondary | ICD-10-CM | POA: Diagnosis not present

## 2019-06-23 DIAGNOSIS — M25511 Pain in right shoulder: Secondary | ICD-10-CM | POA: Diagnosis not present

## 2019-06-23 DIAGNOSIS — M62838 Other muscle spasm: Secondary | ICD-10-CM | POA: Insufficient documentation

## 2019-06-23 MED ORDER — METHOCARBAMOL 500 MG PO TABS: 500.0000 mg | ORAL_TABLET | Freq: Two times a day (BID) | ORAL | 0 refills | Status: DC

## 2019-06-23 MED ORDER — DICLOFENAC SODIUM 50 MG PO TBEC: 50.0000 mg | DELAYED_RELEASE_TABLET | Freq: Two times a day (BID) | ORAL | 0 refills | Status: DC

## 2019-06-23 NOTE — ED Triage Notes (Signed)
Pt reports right shoulder pain since yesterday. Feels he has a knot on his collar bone.

## 2019-06-23 NOTE — Discharge Instructions (Addendum)
Return if any problems.
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
## 2020-02-09 ENCOUNTER — Emergency Department (HOSPITAL_COMMUNITY)
Admission: EM | Admit: 2020-02-09 | Discharge: 2020-02-09 | Discharge status: Absent without leave | Payer: Medicaid Other

## 2020-04-13 ENCOUNTER — Encounter (HOSPITAL_COMMUNITY): Payer: Self-pay | Admitting: Emergency Medicine

## 2020-04-13 ENCOUNTER — Emergency Department (HOSPITAL_COMMUNITY)
Admission: EM | Admit: 2020-04-13 | Discharge: 2020-04-13 | Disposition: A | Discharge status: Home / Self Care | Payer: Medicaid Other | Attending: Emergency Medicine | Admitting: Emergency Medicine

## 2020-04-13 ENCOUNTER — Other Ambulatory Visit: Payer: Self-pay

## 2020-04-13 DIAGNOSIS — Z5321 Procedure and treatment not carried out due to patient leaving prior to being seen by health care provider: Secondary | ICD-10-CM | POA: Diagnosis not present

## 2020-04-13 DIAGNOSIS — S6991XA Unspecified injury of right wrist, hand and finger(s), initial encounter: Secondary | ICD-10-CM | POA: Diagnosis not present

## 2020-04-13 DIAGNOSIS — W25XXXA Contact with sharp glass, initial encounter: Secondary | ICD-10-CM | POA: Diagnosis not present
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
## 2020-12-15 ENCOUNTER — Other Ambulatory Visit: Payer: Self-pay

## 2020-12-15 ENCOUNTER — Encounter (HOSPITAL_COMMUNITY): Payer: Self-pay | Admitting: Emergency Medicine

## 2020-12-15 ENCOUNTER — Emergency Department (HOSPITAL_COMMUNITY)
Admission: EM | Admit: 2020-12-15 | Discharge: 2020-12-15 | Disposition: A | Discharge status: Home / Self Care | Payer: Medicaid Other | Attending: Emergency Medicine | Admitting: Emergency Medicine

## 2020-12-15 DIAGNOSIS — Z202 Contact with and (suspected) exposure to infections with a predominantly sexual mode of transmission: Secondary | ICD-10-CM | POA: Insufficient documentation

## 2020-12-15 DIAGNOSIS — R369 Urethral discharge, unspecified: Secondary | ICD-10-CM | POA: Diagnosis present

## 2020-12-15 LAB — URINALYSIS, ROUTINE W REFLEX MICROSCOPIC
Bilirubin Urine: NEGATIVE
Glucose, UA: NEGATIVE mg/dL
Hgb urine dipstick: NEGATIVE
Ketones, ur: 80 mg/dL — AB
Nitrite: NEGATIVE
Protein, ur: NEGATIVE mg/dL
Specific Gravity, Urine: 1.024 (ref 1.005–1.030)
pH: 7 (ref 5.0–8.0)

## 2020-12-15 MED ORDER — CEFTRIAXONE SODIUM 500 MG IJ SOLR
500.0000 mg | Freq: Once | INTRAMUSCULAR | Status: AC
  Administered 2020-12-15: 500 mg via INTRAMUSCULAR
  Filled 2020-12-15: qty 500

## 2020-12-15 MED ORDER — DOXYCYCLINE HYCLATE 100 MG PO CAPS: 100.0000 mg | ORAL_CAPSULE | Freq: Two times a day (BID) | ORAL | 0 refills | Status: AC

## 2020-12-15 MED ORDER — LIDOCAINE HCL (PF) 1 % IJ SOLN
1.0000 mL | Freq: Once | INTRAMUSCULAR | Status: AC
  Administered 2020-12-15: 1 mL
  Filled 2020-12-15: qty 30

## 2020-12-15 NOTE — ED Triage Notes (Signed)
Pt states he is here to be checked for STDs.

## 2020-12-15 NOTE — Discharge Instructions (Addendum)
I am currently treating for a STI, starting you on antibiotics please take as prescribed.  Please abstain from sexual intercourse until you get your results back you can find these on your MyChart account or the hospital will call you.  If you are positive he must abstain from sexual intercourse for a total of 7 days.  As you can give it to other people.  If you are negative for gonorrhea and Chlamydia you can go back to sexual intercourse I do recommend safe sex practices.    For future STD exposures please follow-up with the health department as they can screen you and treat you for free.  Come back to the emergency department if you develop chest pain, shortness of breath, severe abdominal pain, uncontrolled nausea, vomiting, diarrhea.

## 2020-12-15 NOTE — ED Provider Notes (Signed)
[facility] EMERGENCY DEPARTMENT Provider Note   CSN: [id number] Arrival date & time: 12/15/20  1024     History Chief Complaint  Patient presents with   Exposure to STD    Stephen Byrd is a 20 y.o. male.  HPI  Patient with no significant medical history presents to the emergency department with chief complaint of STD check.  Patient states he had sexual intercourse a few days ago and then noted some penile discharge.  He denies  testicular pain, saddle pain, denies urinary urgency, frequency, dysuria, he denies abdominal pain, nausea, vomiting, diarrhea, denies  fevers or chills.  Patient states he has had chlamydia in the past and states this feels similar to that presentation, he has no other complaints at this time.  He just wants to be checked out.  Past Medical History:  Diagnosis Date   ADHD (attention deficit hyperactivity disorder)    Bipolar 1 disorder (HCC)    Heart murmur     There are no problems to display for this patient.   Past Surgical History:  Procedure Laterality Date   ACROMIO-CLAVICULAR JOINT REPAIR Left 02/13/2018   Procedure: LEFT SHOULDER ACROMIOCLAVICULAR RECONSTRUCTION;  Surgeon: Cammy Copa, MD;  Location: [facility] OR;  Service: Orthopedics;  Laterality: Left;   NO PAST SURGERIES         Family History  Problem Relation Age of Onset   Hypertension Mother    Hypertension Maternal Grandfather     Social History   Tobacco Use   Smoking status: Never   Smokeless tobacco: Never  Vaping Use   Vaping Use: Never used  Substance Use Topics   Alcohol use: No   Drug use: Yes    Types: Marijuana    Home Medications Prior to Admission medications   Medication Sig Start Date End Date Taking? Authorizing Provider  doxycycline (VIBRAMYCIN) 100 MG capsule Take 1 capsule (100 mg total) by mouth 2 (two) times daily for 7 days. 12/15/20 12/22/20 Yes Carroll Sage, PA-C  acetaminophen-codeine (TYLENOL #3) 300-30 MG tablet Take 1 tablet by  mouth every 8 (eight) hours as needed for moderate pain. Patient not taking: No sig reported 04/10/18   Cammy Copa, MD  benzonatate (TESSALON) 100 MG capsule Take 1 capsule (100 mg total) by mouth every 8 (eight) hours. Patient not taking: No sig reported 04/06/18   Janne Napoleon, NP  diclofenac (VOLTAREN) 50 MG EC tablet Take 1 tablet (50 mg total) by mouth 2 (two) times daily. Patient not taking: No sig reported 06/23/19   Elson Areas, PA-C  HYDROcodone-acetaminophen (NORCO/VICODIN) 5-325 MG tablet 1 po q 6-8 hrs prn pain Patient not taking: No sig reported 02/21/18   Cammy Copa, MD  ibuprofen (ADVIL) 800 MG tablet Take 1 tablet (800 mg total) by mouth every 6 (six) hours as needed for moderate pain. Patient not taking: No sig reported 05/02/19   Gilda Crease, MD  lidocaine (LIDODERM) 5 % Place 1 patch onto the skin daily. Apply patch directly to left shoulder; Remove & Discard patch within 12 hours 12/30/18   Petrucelli, Samantha R, PA-C  methocarbamol (ROBAXIN) 500 MG tablet Take 1 tablet (500 mg total) by mouth 2 (two) times daily. Patient not taking: No sig reported 06/23/19   Elson Areas, PA-C  naproxen (NAPROSYN) 500 MG tablet Take 1 tablet (500 mg total) by mouth 2 (two) times daily. Patient not taking: No sig reported 03/16/19   Petrucelli, Pleas Koch, PA-C  oseltamivir (TAMIFLU) 75 MG capsule Take 1 capsule (75 mg total) by mouth every 12 (twelve) hours. Patient not taking: No sig reported 04/06/18   Janne Napoleon, NP    Allergies    Patient has no known allergies.  Review of Systems   Review of Systems  Constitutional:  Negative for chills and fever.  HENT:  Negative for congestion.   Respiratory:  Negative for shortness of breath.   Cardiovascular:  Negative for chest pain.  Gastrointestinal:  Negative for abdominal pain.  Genitourinary:  Positive for penile discharge. Negative for dysuria, enuresis, flank pain, penile pain and testicular pain.   Musculoskeletal:  Negative for back pain.  Skin:  Negative for rash.  Neurological:  Negative for dizziness.  Hematological:  Does not bruise/bleed easily.   Physical Exam Updated Vital Signs BP 119/73 (BP Location: Right Arm)   Pulse 70   Temp 98.6 F (37 C) (Tympanic)   Ht 5\' 8"  (1.727 m)   Wt 72.6 kg   SpO2 99%   BMI 24.33 kg/m   Physical Exam Vitals and nursing note reviewed.  Constitutional:      General: He is not in acute distress.    Appearance: He is not ill-appearing.  HENT:     Head: Normocephalic and atraumatic.     Nose: No congestion.  Eyes:     Conjunctiva/sclera: Conjunctivae normal.  Cardiovascular:     Rate and Rhythm: Normal rate and regular rhythm.  Pulmonary:     Effort: Pulmonary effort is normal.  Abdominal:     Palpations: Abdomen is soft.     Tenderness: There is no abdominal tenderness. There is no right CVA tenderness or left CVA tenderness.  Skin:    General: Skin is warm and dry.  Neurological:     Mental Status: He is alert.  Psychiatric:        Mood and Affect: Mood normal.    ED Results / Procedures / Treatments   Labs (all labs ordered are listed, but only abnormal results are displayed) Labs Reviewed  URINALYSIS, ROUTINE W REFLEX MICROSCOPIC - Abnormal; Notable for the following components:      Result Value   APPearance CLOUDY (*)    Ketones, ur 80 (*)    Leukocytes,Ua LARGE (*)    Bacteria, UA RARE (*)    All other components within normal limits  URINE CULTURE  GC/CHLAMYDIA PROBE AMP ([place]) NOT AT [facility]    EKG None  Radiology No results found.  Procedures Procedures   Medications Ordered in ED Medications  cefTRIAXone (ROCEPHIN) injection 500 mg (500 mg Intramuscular Given 12/15/20 1400)  lidocaine (PF) (XYLOCAINE) 1 % injection 1 mL (1 mL Other Given 12/15/20 1400)    ED Course  I have reviewed the triage vital signs and the nursing notes.  Pertinent labs & imaging results that were available during  my care of the patient were reviewed by me and considered in my medical decision making (see chart for details).    MDM Rules/Calculators/A&P                          Initial impression-patient presents with concern of an STD.  He is alert, does not appear in acute stress, vital signs reassuring.  Will obtain UA, GC chlamydia and really evaluate.  Work-up-UA shows negative nitrites, shows large leukocytes, no red blood cells, 20-50 white blood cells, rare bacteria.  Rule out-I have low suspicion form UTI,  pyelonephritis, kidney stone as patient has no urinary symptoms, denies frequency, dysuria, hematuria, he has no CVA tenderness, no abdominal tenderness on my exam.  He does have a large leukocytes white blood cells and rare bacteria but I suspect this is likely due to a coinciding STI.  Will defer antibiotic treatment as he has no urinary symptoms, will culture urine for further evaluation.  Low suspicion for prostatitis as patient denies any saddle pain.  Low suspicion for testicular torsion he denies any testicular pain.  Plan- STI-patient had a recent exposure with STI patient was treated prophylactically, will continue with doxycycline, urine cultures are pending, follow-up with PCP as needed.   Vital signs have remained stable, no indication for hospital admission.  Patient given at home care as well strict return precautions.  Patient verbalized that they understood agreed to said plan.  Final Clinical Impression(s) / ED Diagnoses Final diagnoses:  Exposure to STD    Rx / DC Orders ED Discharge Orders          Ordered    doxycycline (VIBRAMYCIN) 100 MG capsule  2 times daily        12/15/20 1516             Barnie Del 12/15/20 1522    Bethann Berkshire, MD 12/16/20 [phone]

## 2020-12-16 LAB — GC/CHLAMYDIA PROBE AMP (~~LOC~~) NOT AT ARMC
Chlamydia: NEGATIVE
Comment: NEGATIVE
Comment: NORMAL
Neisseria Gonorrhea: POSITIVE — AB

## 2020-12-17 LAB — URINE CULTURE: Culture: <10000 — AB

## 2021-02-03 ENCOUNTER — Ambulatory Visit: Payer: Medicaid Other | Admitting: Orthopedic Surgery

## 2021-02-17 ENCOUNTER — Other Ambulatory Visit: Payer: Self-pay

## 2021-02-17 ENCOUNTER — Ambulatory Visit (INDEPENDENT_AMBULATORY_CARE_PROVIDER_SITE_OTHER): Payer: Medicaid Other | Admitting: Orthopedic Surgery

## 2021-02-17 ENCOUNTER — Ambulatory Visit: Payer: Self-pay

## 2021-02-17 DIAGNOSIS — M25512 Pain in left shoulder: Secondary | ICD-10-CM | POA: Diagnosis not present

## 2021-02-17 MED ORDER — MELOXICAM 15 MG PO TABS: 15.0000 mg | ORAL_TABLET | Freq: Every day | ORAL | 0 refills | Status: DC

## 2021-02-17 MED ORDER — METHOCARBAMOL 500 MG PO TABS
500.0000 mg | ORAL_TABLET | Freq: Three times a day (TID) | ORAL | 0 refills | Status: DC | PRN
Start: 2021-02-17 — End: 2021-07-19

## 2021-02-21 ENCOUNTER — Encounter: Payer: Self-pay | Admitting: Orthopedic Surgery

## 2021-02-21 NOTE — Progress Notes (Signed)
Office Visit Note   Patient: Stephen Byrd           Date of Birth: Oct 27, 2000           MRN: [id number] Visit Date: 02/17/2021 Requested by: No referring provider defined for this encounter. PCP: Pcp, No  Subjective: Chief Complaint  Patient presents with   Left Shoulder - Pain    HPI: Stephen Byrd is a 20 year old patient with left shoulder pain.  Had surgery in 2019 for [facility] dislocation.  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  Has stiffness after he plays basketball.  Cold hurts his shoulder.  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  He had to stop that job day because of his pain.  Denies any radicular symptoms or neck symptoms.              ROS: All systems reviewed are negative as they relate to the chief complaint within the history of present illness.  Patient denies  fevers or chills.   Assessment & Plan: Visit Diagnoses:  1. Left shoulder pain, unspecified chronicity     Plan: Impression is left shoulder pain with possible intra-articular pathology.  [facility] joint remains reduced with no apparent hardware complications.  May have AC joint arthritis or intra-articular labral pathology.  Plan MRI scan to evaluate.  Robaxin and Mobic prescribed.  Follow-up after study.  Follow-Up Instructions: Return for after MRI.   Orders:  Orders Placed This Encounter  Procedures   XR Shoulder Left   MR Shoulder Left w/ contrast   Arthrogram   Meds ordered this encounter  Medications   methocarbamol (ROBAXIN) 500 MG tablet    Sig: Take 1 tablet (500 mg total) by mouth every 8 (eight) hours as needed for muscle spasms.    Dispense:  30 tablet    Refill:  0   meloxicam (MOBIC) 15 MG tablet    Sig: Take 1 tablet (15 mg total) by mouth daily.    Dispense:  30 tablet    Refill:  0      Procedures: No procedures performed   Clinical Data: No additional findings.  Objective: Vital Signs: There were no vitals taken for this visit.  Physical Exam:    Constitutional: Patient appears well-developed HEENT:  Head: Normocephalic Eyes:EOM are normal Neck: Normal range of motion Cardiovascular: Normal rate Pulmonary/chest: Effort normal Neurologic: Patient is alert Skin: Skin is warm Psychiatric: Patient has normal mood and affect   Ortho Exam: Ortho exam demonstrates good cervical spine range of motion.  Left shoulder has well-healed surgical incision with no instability of the distal clavicle.  No warmth drainage or lymphadenopathy around the incision or shoulder joint on the left.  Radial pulse intact bilaterally.  EPL FPL interosseous wrist flexion extension bicep triceps and deltoid strength intact.  Specialty Comments:  No specialty comments available.  Imaging: No results found.   PMFS History: There are no problems to display for this patient.  Past Medical History:  Diagnosis Date   ADHD (attention deficit hyperactivity disorder)    Bipolar 1 disorder (HCC)    Heart murmur     Family History  Problem Relation Age of Onset   Hypertension Mother    Hypertension Maternal Grandfather     Past Surgical History:  Procedure Laterality Date   ACROMIO-CLAVICULAR JOINT REPAIR Left 02/13/2018   Procedure: LEFT SHOULDER ACROMIOCLAVICULAR RECONSTRUCTION;  Surgeon: Cammy Copa, MD;  Location: [facility] OR;  Service: Orthopedics;  Laterality: Left;  NO PAST SURGERIES     Social History   Occupational History   Not on file  Tobacco Use   Smoking status: Never   Smokeless tobacco: Never  Vaping Use   Vaping Use: Never used  Substance and Sexual Activity   Alcohol use: No   Drug use: Yes    Types: Marijuana   Sexual activity: Not on file

## 2021-02-23 ENCOUNTER — Telehealth: Payer: Self-pay | Admitting: Orthopedic Surgery

## 2021-03-04 ENCOUNTER — Inpatient Hospital Stay: Admission: RE | Admit: 2021-03-04 | Payer: Medicaid Other | Source: Ambulatory Visit

## 2021-03-04 ENCOUNTER — Other Ambulatory Visit: Payer: Medicaid Other

## 2021-04-01 DIAGNOSIS — Z419 Encounter for procedure for purposes other than remedying health state, unspecified: Secondary | ICD-10-CM | POA: Diagnosis not present

## 2021-05-02 DIAGNOSIS — Z419 Encounter for procedure for purposes other than remedying health state, unspecified: Secondary | ICD-10-CM | POA: Diagnosis not present

## 2021-06-02 DIAGNOSIS — Z419 Encounter for procedure for purposes other than remedying health state, unspecified: Secondary | ICD-10-CM | POA: Diagnosis not present

## 2021-06-30 DIAGNOSIS — Z419 Encounter for procedure for purposes other than remedying health state, unspecified: Secondary | ICD-10-CM | POA: Diagnosis not present

## 2021-07-15 ENCOUNTER — Emergency Department (HOSPITAL_COMMUNITY)
Admission: EM | Admit: 2021-07-15 | Discharge: 2021-07-15 | Disposition: A | Discharge status: Home / Self Care | Payer: Medicaid Other | Attending: Emergency Medicine | Admitting: Emergency Medicine

## 2021-07-15 ENCOUNTER — Other Ambulatory Visit: Payer: Self-pay

## 2021-07-15 ENCOUNTER — Encounter (HOSPITAL_COMMUNITY): Payer: Self-pay

## 2021-07-15 DIAGNOSIS — Z202 Contact with and (suspected) exposure to infections with a predominantly sexual mode of transmission: Secondary | ICD-10-CM | POA: Diagnosis not present

## 2021-07-15 DIAGNOSIS — A64 Unspecified sexually transmitted disease: Secondary | ICD-10-CM | POA: Diagnosis not present

## 2021-07-15 DIAGNOSIS — R369 Urethral discharge, unspecified: Secondary | ICD-10-CM | POA: Diagnosis present

## 2021-07-15 MED ORDER — STERILE WATER FOR INJECTION IJ SOLN
INTRAMUSCULAR | Status: AC
  Administered 2021-07-15: 1 mL
  Filled 2021-07-15: qty 10

## 2021-07-15 MED ORDER — CEFTRIAXONE SODIUM 500 MG IJ SOLR
500.0000 mg | Freq: Once | INTRAMUSCULAR | Status: AC
  Administered 2021-07-15: 500 mg via INTRAMUSCULAR
  Filled 2021-07-15: qty 500

## 2021-07-15 MED ORDER — DOXYCYCLINE HYCLATE 100 MG PO CAPS: 100.0000 mg | ORAL_CAPSULE | Freq: Two times a day (BID) | ORAL | 0 refills | Status: DC
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
## 2021-07-15 NOTE — ED Triage Notes (Signed)
Pt c/o green discharge coming from penis x couple days ?

## 2021-07-15 NOTE — Discharge Instructions (Signed)
Please let any partners that you have had in the last 6 months know that they may have been exposed to an STD if you test positive today.  You should have no sexual intercourse for the next 10 days.  In the future please make sure that you are practicing safe sex practices with condoms or other barrier protection. ? ?I would like you to go to the health department to be tested for HIV ?

## 2021-07-16 ENCOUNTER — Telehealth: Payer: Self-pay

## 2021-07-16 NOTE — Telephone Encounter (Signed)
Transition Care Management Unsuccessful Follow-up Telephone Call ? ?Date of discharge and from where:  07/15/2021-[place] ? ?Attempts:  1st Attempt ? ?Reason for unsuccessful TCM follow-up call:  Unable to reach patient ? ?  ?

## 2021-07-17 ENCOUNTER — Other Ambulatory Visit: Payer: Self-pay

## 2021-07-17 ENCOUNTER — Encounter (HOSPITAL_COMMUNITY): Payer: Self-pay | Admitting: *Deleted

## 2021-07-17 ENCOUNTER — Inpatient Hospital Stay (HOSPITAL_COMMUNITY)
Admission: EM | Admit: 2021-07-17 | Discharge: 2021-07-19 | DRG: 549 | Disposition: A | Discharge status: Home / Self Care | Payer: Medicaid Other | Attending: Internal Medicine | Admitting: Internal Medicine

## 2021-07-17 DIAGNOSIS — F909 Attention-deficit hyperactivity disorder, unspecified type: Secondary | ICD-10-CM | POA: Diagnosis present

## 2021-07-17 DIAGNOSIS — A5442 Gonococcal arthritis: Principal | ICD-10-CM | POA: Diagnosis present

## 2021-07-17 DIAGNOSIS — F1990 Other psychoactive substance use, unspecified, uncomplicated: Secondary | ICD-10-CM | POA: Diagnosis present

## 2021-07-17 DIAGNOSIS — M1990 Unspecified osteoarthritis, unspecified site: Secondary | ICD-10-CM

## 2021-07-17 DIAGNOSIS — F1290 Cannabis use, unspecified, uncomplicated: Secondary | ICD-10-CM | POA: Diagnosis present

## 2021-07-17 DIAGNOSIS — Z20822 Contact with and (suspected) exposure to covid-19: Secondary | ICD-10-CM | POA: Diagnosis present

## 2021-07-17 DIAGNOSIS — A5401 Gonococcal cystitis and urethritis, unspecified: Secondary | ICD-10-CM | POA: Diagnosis present

## 2021-07-17 DIAGNOSIS — M00861 Arthritis due to other bacteria, right knee: Secondary | ICD-10-CM

## 2021-07-17 DIAGNOSIS — M009 Pyogenic arthritis, unspecified: Secondary | ICD-10-CM | POA: Diagnosis present

## 2021-07-17 DIAGNOSIS — F319 Bipolar disorder, unspecified: Secondary | ICD-10-CM | POA: Diagnosis present

## 2021-07-17 DIAGNOSIS — M25461 Effusion, right knee: Secondary | ICD-10-CM | POA: Diagnosis not present

## 2021-07-17 DIAGNOSIS — A549 Gonococcal infection, unspecified: Secondary | ICD-10-CM | POA: Diagnosis present

## 2021-07-17 DIAGNOSIS — M25572 Pain in left ankle and joints of left foot: Secondary | ICD-10-CM | POA: Diagnosis not present

## 2021-07-17 DIAGNOSIS — Z79899 Other long term (current) drug therapy: Secondary | ICD-10-CM

## 2021-07-17 DIAGNOSIS — A568 Sexually transmitted chlamydial infection of other sites: Secondary | ICD-10-CM | POA: Diagnosis present

## 2021-07-17 DIAGNOSIS — Z8249 Family history of ischemic heart disease and other diseases of the circulatory system: Secondary | ICD-10-CM

## 2021-07-17 LAB — URINALYSIS, ROUTINE W REFLEX MICROSCOPIC
Bacteria, UA: NONE SEEN
Bilirubin Urine: NEGATIVE
Glucose, UA: NEGATIVE mg/dL
Ketones, ur: NEGATIVE mg/dL
Nitrite: NEGATIVE
Protein, ur: 30 mg/dL — AB
RBC / HPF: >50 RBC/hpf — ABNORMAL HIGH (ref 0–5)
Specific Gravity, Urine: 1.023 (ref 1.005–1.030)
WBC, UA: >50 WBC/hpf — ABNORMAL HIGH (ref 0–5)
pH: 7 (ref 5.0–8.0)

## 2021-07-17 LAB — SYNOVIAL CELL COUNT + DIFF, W/ CRYSTALS
Crystals, Fluid: NONE SEEN
Eosinophils-Synovial: 0 % (ref 0–1)
Lymphocytes-Synovial Fld: 4 % (ref 0–20)
Monocyte-Macrophage-Synovial Fluid: 13 % — ABNORMAL LOW (ref 50–90)
Neutrophil, Synovial: 83 % — ABNORMAL HIGH (ref 0–25)
Other Cells-SYN: 0
WBC, Synovial: 5460 /mm3 — ABNORMAL HIGH (ref 0–200)

## 2021-07-17 LAB — CBC WITH DIFFERENTIAL/PLATELET
Abs Immature Granulocytes: 0.03 10*3/uL (ref 0.00–0.07)
Basophils Absolute: 0.1 10*3/uL (ref 0.0–0.1)
Basophils Relative: 1 %
Eosinophils Absolute: 0.4 10*3/uL (ref 0.0–0.5)
Eosinophils Relative: 5 %
HCT: 41 % (ref 39.0–52.0)
Hemoglobin: 13.1 g/dL (ref 13.0–17.0)
Immature Granulocytes: 0 %
Lymphocytes Relative: 30 %
Lymphs Abs: 2.6 10*3/uL (ref 0.7–4.0)
MCH: 27.2 pg (ref 26.0–34.0)
MCHC: 32 g/dL (ref 30.0–36.0)
MCV: 85.2 fL (ref 80.0–100.0)
Monocytes Absolute: 0.9 10*3/uL (ref 0.1–1.0)
Monocytes Relative: 10 %
Neutro Abs: 4.5 10*3/uL (ref 1.7–7.7)
Neutrophils Relative %: 54 %
Platelets: 274 10*3/uL (ref 150–400)
RBC: 4.81 MIL/uL (ref 4.22–5.81)
RDW: 13.5 % (ref 11.5–15.5)
WBC: 8.5 10*3/uL (ref 4.0–10.5)
nRBC: 0 % (ref 0.0–0.2)

## 2021-07-17 LAB — BASIC METABOLIC PANEL
Anion gap: 10 (ref 5–15)
BUN: 14 mg/dL (ref 6–20)
CO2: 27 mmol/L (ref 22–32)
Calcium: 9.1 mg/dL (ref 8.9–10.3)
Chloride: 100 mmol/L (ref 98–111)
Creatinine, Ser: 0.97 mg/dL (ref 0.61–1.24)
GFR, Estimated: >60 mL/min (ref >60)
Glucose, Bld: 78 mg/dL (ref 70–99)
Potassium: 4.1 mmol/L (ref 3.5–5.1)
Sodium: 137 mmol/L (ref 135–145)

## 2021-07-17 LAB — C-REACTIVE PROTEIN: CRP: 2.4 mg/dL — ABNORMAL HIGH (ref <1.0)

## 2021-07-17 LAB — SEDIMENTATION RATE: Sed Rate: 30 mm/hr — ABNORMAL HIGH (ref 0–16)

## 2021-07-17 MED ORDER — SODIUM CHLORIDE 0.9 % IV BOLUS
500.0000 mL | Freq: Once | INTRAVENOUS | Status: AC
  Administered 2021-07-17: 500 mL via INTRAVENOUS

## 2021-07-17 MED ORDER — KETOROLAC TROMETHAMINE 30 MG/ML IJ SOLN
30.0000 mg | Freq: Three times a day (TID) | INTRAMUSCULAR | Status: DC
  Administered 2021-07-18 (×2): 30 mg via INTRAVENOUS
  Filled 2021-07-17 (×2): qty 1

## 2021-07-17 MED ORDER — LIDOCAINE-EPINEPHRINE (PF) 2 %-1:200000 IJ SOLN
10.0000 mL | Freq: Once | INTRAMUSCULAR | Status: AC
  Administered 2021-07-17: 10 mL via INTRADERMAL
  Filled 2021-07-17: qty 20

## 2021-07-17 MED ORDER — SODIUM CHLORIDE 0.9 % IV SOLN
2.0000 g | INTRAVENOUS | Status: DC
  Administered 2021-07-18 (×2): 2 g via INTRAVENOUS
  Filled 2021-07-17 (×2): qty 20

## 2021-07-17 NOTE — ED Notes (Signed)
ED TO INPATIENT HANDOFF REPORT  ED Nurse Name and Phone #:   S Name/Age/Gender Stephen Byrd 21 y.o. male Room/Bed: APA07/APA07  Code Status   Code Status: Not on file  Home/SNF/Other Home Patient oriented to: self, place, time, and situation Is this baseline? Yes   Triage Complete: Triage complete  Chief Complaint Septic joint (HCC) [M00.9]  Triage Note Pt with left ankle pain and right knee pain.  Pt using a cane with ambulating to triage.  Pain started 2-3 days ago. Denies any injuries or fall. Pt states he is flat footed.    Allergies No Known Allergies  Level of Care/Admitting Diagnosis ED Disposition     ED Disposition  Admit   Condition  --   Comment  Hospital Area: [facility] [[id number]]  Level of Care: Med-Surg [16]  Covid Evaluation: Asymptomatic - no recent exposure (last 10 days) testing not required  Diagnosis: Septic joint [facility]) [[id number]]  Admitting Physician: Lilyan Gilford [[id number]]  Attending Physician: Vickki Hearing [[id number]]          B Medical/Surgery History Past Medical History:  Diagnosis Date   ADHD (attention deficit hyperactivity disorder)    Bipolar 1 disorder (HCC)    Heart murmur    Past Surgical History:  Procedure Laterality Date   ACROMIO-CLAVICULAR JOINT REPAIR Left 02/13/2018   Procedure: LEFT SHOULDER ACROMIOCLAVICULAR RECONSTRUCTION;  Surgeon: Cammy Copa, MD;  Location: [facility] OR;  Service: Orthopedics;  Laterality: Left;   NO PAST SURGERIES       A IV Location/Drains/Wounds Patient Lines/Drains/Airways Status     Active Line/Drains/Airways     Name Placement date Placement time Site Days   Peripheral IV 07/17/21 20 G Right Forearm 07/17/21  2136  Forearm  less than 1   Incision (Closed) 02/13/18 Shoulder Left 02/13/18  1824  -- 1250            Intake/Output Last 24 hours No intake or output data in the 24 hours ending 07/17/21 2137  Labs/Imaging Results for orders placed or  performed during the hospital encounter of 07/17/21 (from the past 48 hour(s))  Urinalysis, Routine w reflex microscopic Urine, Clean Catch     Status: Abnormal   Collection Time: 07/17/21  2:36 PM  Result Value Ref Range   Color, Urine YELLOW YELLOW   APPearance HAZY (A) CLEAR   Specific Gravity, Urine 1.023 1.005 - 1.030   pH 7.0 5.0 - 8.0   Glucose, UA NEGATIVE NEGATIVE mg/dL   Hgb urine dipstick SMALL (A) NEGATIVE   Bilirubin Urine NEGATIVE NEGATIVE   Ketones, ur NEGATIVE NEGATIVE mg/dL   Protein, ur 30 (A) NEGATIVE mg/dL   Nitrite NEGATIVE NEGATIVE   Leukocytes,Ua MODERATE (A) NEGATIVE   RBC / HPF >50 (H) 0 - 5 RBC/hpf   WBC, UA >50 (H) 0 - 5 WBC/hpf   Bacteria, UA NONE SEEN NONE SEEN   Squamous Epithelial / LPF 0-5 0 - 5   WBC Clumps PRESENT    Mucus PRESENT    Budding Yeast PRESENT     Comment: Performed at [facility], [address], [place], [place] [address]  Cell count + diff,  w/ cryst-synvl fld     Status: Abnormal   Collection Time: 07/17/21  3:06 PM  Result Value Ref Range   Color, Synovial RED (A) YELLOW   Appearance-Synovial CLOUDY (A) CLEAR   Crystals, Fluid NO CRYSTALS SEEN    WBC, Synovial 5,460 (H) 0 - 200 /cu mm  Neutrophil, Synovial 83 (H) 0 - 25 %   Lymphocytes-Synovial Fld 4 0 - 20 %   Monocyte-Macrophage-Synovial Fluid 13 (L) 50 - 90 %   Eosinophils-Synovial 0 0 - 1 %   Other Cells-SYN 0     Comment: Performed at [facility], [address] [place], [place] [address]  Culture, blood (routine x 2)     Status: None (Preliminary result)   Collection Time: 07/17/21  3:12 PM   Specimen: Left Antecubital; Blood  Result Value Ref Range   Specimen Description      LEFT ANTECUBITAL BOTTLES DRAWN AEROBIC AND ANAEROBIC   Special Requests      Blood Culture adequate volume Performed at [facility], [address] [place], [place] [address]    Culture PENDING    Report Status PENDING   Basic metabolic panel     Status: None   Collection Time: 07/17/21   3:12 PM  Result Value Ref Range   Sodium 137 135 - 145 mmol/L   Potassium 4.1 3.5 - 5.1 mmol/L   Chloride 100 98 - 111 mmol/L   CO2 27 22 - 32 mmol/L   Glucose, Bld 78 70 - 99 mg/dL    Comment: Glucose reference range applies only to samples taken after fasting for at least 8 hours.   BUN 14 6 - 20 mg/dL   Creatinine, Ser 0.98 0.61 - 1.24 mg/dL   Calcium 9.1 8.9 - 11.9 mg/dL   GFR, Estimated >14 >78 mL/min    Comment: (NOTE) Calculated using the CKD-EPI Creatinine Equation (2021)    Anion gap 10 5 - 15    Comment: Performed at [facility], [address], [place], [place] [address]  CBC with Differential     Status: None   Collection Time: 07/17/21  3:12 PM  Result Value Ref Range   WBC 8.5 4.0 - 10.5 K/uL   RBC 4.81 4.22 - 5.81 MIL/uL   Hemoglobin 13.1 13.0 - 17.0 g/dL   HCT 13.0 86.5 - 78.4 %   MCV 85.2 80.0 - 100.0 fL   MCH 27.2 26.0 - 34.0 pg   MCHC 32.0 30.0 - 36.0 g/dL   RDW 69.6 29.5 - 28.4 %   Platelets 274 150 - 400 K/uL   nRBC 0.0 0.0 - 0.2 %   Neutrophils Relative % 54 %   Neutro Abs 4.5 1.7 - 7.7 K/uL   Lymphocytes Relative 30 %   Lymphs Abs 2.6 0.7 - 4.0 K/uL   Monocytes Relative 10 %   Monocytes Absolute 0.9 0.1 - 1.0 K/uL   Eosinophils Relative 5 %   Eosinophils Absolute 0.4 0.0 - 0.5 K/uL   Basophils Relative 1 %   Basophils Absolute 0.1 0.0 - 0.1 K/uL   Immature Granulocytes 0 %   Abs Immature Granulocytes 0.03 0.00 - 0.07 K/uL    Comment: Performed at [facility], [address] [place], [place] [address]  Sedimentation rate     Status: Abnormal   Collection Time: 07/17/21  3:12 PM  Result Value Ref Range   Sed Rate 30 (H) 0 - 16 mm/hr    Comment: Performed at [facility], [address], [place], [place] [address]  C-reactive protein     Status: Abnormal   Collection Time: 07/17/21  3:12 PM  Result Value Ref Range   CRP 2.4 (H) <1.0 mg/dL    Comment: Performed at [facility], [address] [place], [place] [address]  Culture, blood  (  routine x 2)     Status: None (Preliminary result)   Collection Time: 07/17/21  3:15 PM   Specimen: Right Antecubital; Blood  Result Value Ref Range   Specimen Description      RIGHT ANTECUBITAL BOTTLES DRAWN AEROBIC AND ANAEROBIC   Special Requests      Blood Culture results may not be optimal due to an excessive volume of blood received in culture bottles Performed at [facility], [address] [place], [place] [address]    Culture PENDING    Report Status PENDING    No results found.  Pending Labs Unresulted Labs (From admission, onward)     Start     Ordered   07/17/21 2026  Culture, blood (single)  Once,   R        07/17/21 2025   07/17/21 1506  Glucose, Body Fluid Other  ([facility] ED BODY FLUID PANEL)  ONCE - STAT,   STAT        07/17/21 1506   07/17/21 1506  Body fluid culture w Gram Stain  Once,   R        07/17/21 1506   Signed and Held  HIV Antibody (routine testing w rflx)  (HIV Antibody (Routine testing w reflex) panel)  Once,   R        Signed and Held   Signed and Held  Comprehensive metabolic panel  Tomorrow morning,   R        Signed and Held   Signed and Held  Magnesium  Tomorrow morning,   R        Signed and Held   Signed and Held  CBC WITH DIFFERENTIAL  Tomorrow morning,   R        Signed and Held            Vitals/Pain Today's Vitals   07/17/21 1242 07/17/21 1242 07/17/21 1243 07/17/21 1811  BP:  137/71  124/76  Pulse:  79  61  Resp:  16  16  Temp:  98.6 F (37 C)    TempSrc:  Oral    SpO2:  99%  100%  Weight:   160 lb (72.6 kg)   Height:   5\' 7"  (1.702 m)   PainSc: 10-Worst pain ever       Isolation Precautions No active isolations  Medications Medications  ketorolac (TORADOL) 30 MG/ML injection 30 mg (has no administration in time range)  cefTRIAXone (ROCEPHIN) 2 g in sodium chloride 0.9 % 100 mL IVPB (has no administration in time range)  lidocaine-EPINEPHrine (XYLOCAINE W/EPI) 2 %-1:200000 (PF) injection 10 mL (10 mLs Intradermal  Given 07/17/21 1542)  sodium chloride 0.9 % bolus 500 mL (500 mLs Intravenous New Bag/Given 07/17/21 2137)    Mobility walks with device Moderate fall risk   Focused Assessments    R Recommendations: See Admitting Provider Note  Report given to:   Additional Notes:

## 2021-07-17 NOTE — ED Triage Notes (Signed)
Pt with left ankle pain and right knee pain.  Pt using a cane with ambulating to triage.  Pain started 2-3 days ago. Denies any injuries or fall. Pt states he is flat footed.  ?

## 2021-07-17 NOTE — ED Provider Notes (Signed)
?[place] EMERGENCY DEPARTMENT ?Provider Note ? ? ?CSN: [id number] ?Arrival date & time: 07/17/21  1229 ? ?  ? ?History ? ?No chief complaint on file. ? ? ?Stephen Byrd is a 21 y.o. male with a history of bipolar 1 disorder and ADHD presenting with joint symptoms following a recent STD.  Seen in the ED on 3/16 and diagnosed with gonorrhea infection.  Provided 1 dose of Rocephin during hospital course.  Was prescribed doxycycline, but never picked it up.  Shortly after leaving the ED, noted increased swelling, pain, and warmth with reduced range of motion of the right knee and left ankle.  Has been borrowing his family members cane to assist his ability to walk.  Has tried a muscle relaxant from a previous joint injury with no help.  Using the joint or putting weight on it makes the pain worse.  Denies fever or chills.  Denies recent trauma.  Denies shortness of breath, chest pain, neck stiffness or soreness. ? ?The history is provided by the patient and medical records.  ? ?  ? ?Home Medications ?Prior to Admission medications   ?Medication Sig Start Date End Date Taking? Authorizing Provider  ?acetaminophen-codeine (TYLENOL #3) 300-30 MG tablet Take 1 tablet by mouth every 8 (eight) hours as needed for moderate pain. ?Patient not taking: No sig reported 04/10/18   Cammy Copa, MD  ?benzonatate (TESSALON) 100 MG capsule Take 1 capsule (100 mg total) by mouth every 8 (eight) hours. ?Patient not taking: No sig reported 04/06/18   Janne Napoleon, NP  ?diclofenac (VOLTAREN) 50 MG EC tablet Take 1 tablet (50 mg total) by mouth 2 (two) times daily. ?Patient not taking: No sig reported 06/23/19   Elson Areas, PA-C  ?doxycycline (VIBRAMYCIN) 100 MG capsule Take 1 capsule (100 mg total) by mouth 2 (two) times daily for 7 days. 07/15/21 07/22/21  Eber Hong, MD  ?HYDROcodone-acetaminophen (NORCO/VICODIN) 5-325 MG tablet 1 po q 6-8 hrs prn pain ?Patient not taking: No sig reported 02/21/18   Cammy Copa, MD   ?ibuprofen (ADVIL) 800 MG tablet Take 1 tablet (800 mg total) by mouth every 6 (six) hours as needed for moderate pain. ?Patient not taking: No sig reported 05/02/19   Gilda Crease, MD  ?lidocaine (LIDODERM) 5 % Place 1 patch onto the skin daily. Apply patch directly to left shoulder; Remove & Discard patch within 12 hours 12/30/18   Petrucelli, Samantha R, PA-C  ?meloxicam (MOBIC) 15 MG tablet Take 1 tablet (15 mg total) by mouth daily. 02/17/21   Cammy Copa, MD  ?methocarbamol (ROBAXIN) 500 MG tablet Take 1 tablet (500 mg total) by mouth 2 (two) times daily. ?Patient not taking: No sig reported 06/23/19   Elson Areas, PA-C  ?methocarbamol (ROBAXIN) 500 MG tablet Take 1 tablet (500 mg total) by mouth every 8 (eight) hours as needed for muscle spasms. 02/17/21   Cammy Copa, MD  ?naproxen (NAPROSYN) 500 MG tablet Take 1 tablet (500 mg total) by mouth 2 (two) times daily. ?Patient not taking: No sig reported 03/16/19   Petrucelli, Samantha R, PA-C  ?oseltamivir (TAMIFLU) 75 MG capsule Take 1 capsule (75 mg total) by mouth every 12 (twelve) hours. ?Patient not taking: No sig reported 04/06/18   Janne Napoleon, NP  ?   ? ?Allergies    ?Patient has no known allergies.   ? ?Review of Systems   ?Review of Systems  ?Musculoskeletal:  Positive for arthralgias (Right knee,  left ankle) and joint swelling.  ?Skin:  Positive for color change.  ? ?Physical Exam ?Updated Vital Signs ?BP 124/76 (BP Location: Right Arm)   Pulse 61   Temp 98.6 ?F (37 ?C) (Oral)   Resp 16   Ht  (1.702 m)   Wt 72.6 kg   SpO2 100%   BMI 25.06 kg/m?  ?Physical Exam ?Vitals and nursing note reviewed.  ?Constitutional:   ?   General: He is not in acute distress. ?   Appearance: Normal appearance. He is well-developed. He is not ill-appearing or diaphoretic.  ?HENT:  ?   Head: Normocephalic and atraumatic.  ?   Mouth/Throat:  ?   Mouth: Mucous membranes are moist.  ?   Pharynx: Oropharynx is clear.  ?Eyes:  ?    General: No scleral icterus. ?   Conjunctiva/sclera: Conjunctivae normal.  ?Cardiovascular:  ?   Rate and Rhythm: Normal rate and regular rhythm.  ?   Pulses: Normal pulses.  ?   Heart sounds: No murmur heard. ?Pulmonary:  ?   Effort: Pulmonary effort is normal. No respiratory distress.  ?   Breath sounds: Normal breath sounds.  ?Abdominal:  ?   Palpations: Abdomen is soft.  ?   Tenderness: There is no abdominal tenderness.  ?Musculoskeletal:     ?   General: No swelling.  ?   Cervical back: Neck supple. No rigidity or tenderness.  ?     Legs: ? ?   Comments: Right knee positive for swelling, warmth, tenderness ?Left medial ankle positive for swelling, warmth, tenderness ? ?Mild erythema and restricted AROM/PROM due to subjective pain noted of both joints described above  ?Skin: ?   General: Skin is warm and dry.  ?   Capillary Refill: Capillary refill takes less than 2 seconds.  ?Neurological:  ?   Mental Status: He is alert and oriented to person, place, and time.  ?   Sensory: No sensory deficit.  ?Psychiatric:     ?   Mood and Affect: Mood normal.  ? ? ?ED Results / Procedures / Treatments   ?Labs ?(all labs ordered are listed, but only abnormal results are displayed) ?Labs Reviewed  ?SEDIMENTATION RATE - Abnormal; Notable for the following components:  ?    Result Value  ? Sed Rate 30 (*)   ? All other components within normal limits  ?C-REACTIVE PROTEIN - Abnormal; Notable for the following components:  ? CRP 2.4 (*)   ? All other components within normal limits  ?URINALYSIS, ROUTINE W REFLEX MICROSCOPIC - Abnormal; Notable for the following components:  ? APPearance HAZY (*)   ? Hgb urine dipstick SMALL (*)   ? Protein, ur 30 (*)   ? Leukocytes,Ua MODERATE (*)   ? RBC / HPF >50 (*)   ? WBC, UA >50 (*)   ? All other components within normal limits  ?SYNOVIAL CELL COUNT + DIFF, W/ CRYSTALS - Abnormal; Notable for the following components:  ? Color, Synovial RED (*)   ? Appearance-Synovial CLOUDY (*)   ? WBC,  Synovial 5,460 (*)   ? Neutrophil, Synovial 83 (*)   ? Monocyte-Macrophage-Synovial Fluid 13 (*)   ? All other components within normal limits  ?CULTURE, BLOOD (ROUTINE X 2)  ?CULTURE, BLOOD (ROUTINE X 2)  ?BODY FLUID CULTURE W GRAM STAIN  ?CULTURE, BLOOD (SINGLE)  ?BASIC METABOLIC PANEL  ?CBC WITH DIFFERENTIAL/PLATELET  ?GLUCOSE, BODY FLUID OTHER            ?  CBG MONITORING, ED  ? ? ?EKG ?None ? ?Radiology ?No results found. ? ?Procedures ?Procedures  ? ? ?Medications Ordered in ED ?Medications  ?ketorolac (TORADOL) 30 MG/ML injection 30 mg (has no administration in time range)  ?cefTRIAXone (ROCEPHIN) 2 g in sodium chloride 0.9 % 100 mL IVPB (has no administration in time range)  ?sodium chloride 0.9 % bolus 500 mL (has no administration in time range)  ?lidocaine-EPINEPHrine (XYLOCAINE W/EPI) 2 %-1:200000 (PF) injection 10 mL (10 mLs Intradermal Given 07/17/21 1542)  ? ? ?ED Course/ Medical Decision Making/ A&P ?  ?                        ?Medical Decision Making ?Amount and/or Complexity of Data Reviewed ?External Data Reviewed: notes. ?Labs: ordered. Decision-making details documented in ED Course. ?Radiology: ordered and independent interpretation performed. Decision-making details documented in ED Course. ?ECG/medicine tests: ordered and independent interpretation performed. Decision-making details documented in ED Course. ? ?Risk ?Prescription drug management. ?Decision regarding hospitalization. ? ? ?21 y.o. male presents to the ED for concern of right knee pain and left ankle pain.  This involves an extensive number of treatment options, and is a complaint that carries with it a high risk of complications and morbidity.  The differential diagnosis includes reactive arthritis, traumatic injury, septic arthritis, gout ? ?Obtained verbal consent to discuss full history, results, treatment, and plan of care with patient's fiancee and pt's mother. ? ?Comorbidities that complicate the patient evaluation include  Bipolar type 1, ADHD ? ?Additional history obtained from internal/external records available via epic ? ?Interpretation: ?I ordered, and personally interpreted labs.  The pertinent results include:  Urinalysis sugge

## 2021-07-18 ENCOUNTER — Observation Stay (HOSPITAL_COMMUNITY): Payer: Medicaid Other

## 2021-07-18 DIAGNOSIS — F1290 Cannabis use, unspecified, uncomplicated: Secondary | ICD-10-CM | POA: Diagnosis not present

## 2021-07-18 DIAGNOSIS — M1990 Unspecified osteoarthritis, unspecified site: Secondary | ICD-10-CM | POA: Diagnosis not present

## 2021-07-18 DIAGNOSIS — M25561 Pain in right knee: Secondary | ICD-10-CM | POA: Diagnosis not present

## 2021-07-18 DIAGNOSIS — F1990 Other psychoactive substance use, unspecified, uncomplicated: Secondary | ICD-10-CM

## 2021-07-18 DIAGNOSIS — M25461 Effusion, right knee: Secondary | ICD-10-CM | POA: Diagnosis not present

## 2021-07-18 DIAGNOSIS — M00861 Arthritis due to other bacteria, right knee: Secondary | ICD-10-CM

## 2021-07-18 DIAGNOSIS — Z8249 Family history of ischemic heart disease and other diseases of the circulatory system: Secondary | ICD-10-CM | POA: Diagnosis not present

## 2021-07-18 DIAGNOSIS — A549 Gonococcal infection, unspecified: Secondary | ICD-10-CM

## 2021-07-18 DIAGNOSIS — F909 Attention-deficit hyperactivity disorder, unspecified type: Secondary | ICD-10-CM | POA: Diagnosis not present

## 2021-07-18 DIAGNOSIS — A5401 Gonococcal cystitis and urethritis, unspecified: Secondary | ICD-10-CM | POA: Diagnosis not present

## 2021-07-18 DIAGNOSIS — M25572 Pain in left ankle and joints of left foot: Secondary | ICD-10-CM

## 2021-07-18 DIAGNOSIS — A568 Sexually transmitted chlamydial infection of other sites: Secondary | ICD-10-CM | POA: Diagnosis not present

## 2021-07-18 DIAGNOSIS — Z79899 Other long term (current) drug therapy: Secondary | ICD-10-CM | POA: Diagnosis not present

## 2021-07-18 DIAGNOSIS — F319 Bipolar disorder, unspecified: Secondary | ICD-10-CM | POA: Diagnosis not present

## 2021-07-18 DIAGNOSIS — Z20822 Contact with and (suspected) exposure to covid-19: Secondary | ICD-10-CM | POA: Diagnosis not present

## 2021-07-18 DIAGNOSIS — M7989 Other specified soft tissue disorders: Secondary | ICD-10-CM | POA: Diagnosis not present

## 2021-07-18 DIAGNOSIS — A5442 Gonococcal arthritis: Secondary | ICD-10-CM | POA: Diagnosis not present

## 2021-07-18 LAB — CBC WITH DIFFERENTIAL/PLATELET
Abs Immature Granulocytes: 0.04 10*3/uL (ref 0.00–0.07)
Basophils Absolute: 0.1 10*3/uL (ref 0.0–0.1)
Basophils Relative: 1 %
Eosinophils Absolute: 0.3 10*3/uL (ref 0.0–0.5)
Eosinophils Relative: 4 %
HCT: 36.3 % — ABNORMAL LOW (ref 39.0–52.0)
Hemoglobin: 11.7 g/dL — ABNORMAL LOW (ref 13.0–17.0)
Immature Granulocytes: 1 %
Lymphocytes Relative: 33 %
Lymphs Abs: 2.5 10*3/uL (ref 0.7–4.0)
MCH: 27 pg (ref 26.0–34.0)
MCHC: 32.2 g/dL (ref 30.0–36.0)
MCV: 83.8 fL (ref 80.0–100.0)
Monocytes Absolute: 1 10*3/uL (ref 0.1–1.0)
Monocytes Relative: 13 %
Neutro Abs: 3.8 10*3/uL (ref 1.7–7.7)
Neutrophils Relative %: 48 %
Platelets: 273 10*3/uL (ref 150–400)
RBC: 4.33 MIL/uL (ref 4.22–5.81)
RDW: 13.3 % (ref 11.5–15.5)
WBC: 7.8 10*3/uL (ref 4.0–10.5)
nRBC: 0 % (ref 0.0–0.2)

## 2021-07-18 LAB — COMPREHENSIVE METABOLIC PANEL
ALT: 36 U/L (ref 0–44)
AST: 25 U/L (ref 15–41)
Albumin: 3.6 g/dL (ref 3.5–5.0)
Alkaline Phosphatase: 84 U/L (ref 38–126)
Anion gap: 9 (ref 5–15)
BUN: 13 mg/dL (ref 6–20)
CO2: 25 mmol/L (ref 22–32)
Calcium: 9 mg/dL (ref 8.9–10.3)
Chloride: 103 mmol/L (ref 98–111)
Creatinine, Ser: 0.83 mg/dL (ref 0.61–1.24)
GFR, Estimated: >60 mL/min (ref >60)
Glucose, Bld: 88 mg/dL (ref 70–99)
Potassium: 4.1 mmol/L (ref 3.5–5.1)
Sodium: 137 mmol/L (ref 135–145)
Total Bilirubin: 0.5 mg/dL (ref 0.3–1.2)
Total Protein: 7.3 g/dL (ref 6.5–8.1)

## 2021-07-18 LAB — RAPID URINE DRUG SCREEN, HOSP PERFORMED
Amphetamines: NOT DETECTED
Barbiturates: NOT DETECTED
Benzodiazepines: NOT DETECTED
Cocaine: NOT DETECTED
Opiates: NOT DETECTED
Tetrahydrocannabinol: POSITIVE — AB

## 2021-07-18 LAB — MAGNESIUM: Magnesium: 2 mg/dL (ref 1.7–2.4)

## 2021-07-18 LAB — RESP PANEL BY RT-PCR (FLU A&B, COVID) ARPGX2
Influenza A by PCR: NEGATIVE
Influenza B by PCR: NEGATIVE
SARS Coronavirus 2 by RT PCR: NEGATIVE

## 2021-07-18 LAB — HIV ANTIBODY (ROUTINE TESTING W REFLEX): HIV Screen 4th Generation wRfx: NONREACTIVE

## 2021-07-18 MED ORDER — ONDANSETRON HCL 4 MG PO TABS: 4.0000 mg | ORAL_TABLET | Freq: Four times a day (QID) | ORAL | Status: DC | PRN

## 2021-07-18 MED ORDER — KETOROLAC TROMETHAMINE 30 MG/ML IJ SOLN: 30.0000 mg | Freq: Four times a day (QID) | INTRAMUSCULAR | Status: DC | PRN

## 2021-07-18 MED ORDER — ONDANSETRON HCL 4 MG/2ML IJ SOLN: 4.0000 mg | Freq: Four times a day (QID) | INTRAMUSCULAR | Status: DC | PRN

## 2021-07-18 MED ORDER — DOXYCYCLINE HYCLATE 100 MG PO TABS
100.0000 mg | ORAL_TABLET | Freq: Two times a day (BID) | ORAL | Status: DC
  Filled 2021-07-18: qty 1

## 2021-07-18 MED ORDER — ENOXAPARIN SODIUM 30 MG/0.3ML IJ SOSY
30.0000 mg | PREFILLED_SYRINGE | INTRAMUSCULAR | Status: DC
Start: 2021-07-18 — End: 2021-07-18

## 2021-07-18 MED ORDER — OXYCODONE HCL 5 MG PO TABS
5.0000 mg | ORAL_TABLET | ORAL | Status: DC | PRN
  Filled 2021-07-18: qty 1

## 2021-07-18 MED ORDER — ACETAMINOPHEN 325 MG PO TABS: 650.0000 mg | ORAL_TABLET | Freq: Four times a day (QID) | ORAL | Status: DC | PRN

## 2021-07-18 MED ORDER — ENOXAPARIN SODIUM 40 MG/0.4ML IJ SOSY
40.0000 mg | PREFILLED_SYRINGE | INTRAMUSCULAR | Status: DC
Start: 2021-07-18 — End: 2021-07-19
  Filled 2021-07-18 (×2): qty 0.4

## 2021-07-18 MED ORDER — POLYETHYLENE GLYCOL 3350 17 G PO PACK: 17.0000 g | PACK | Freq: Every day | ORAL | Status: DC | PRN

## 2021-07-18 MED ORDER — ACETAMINOPHEN 650 MG RE SUPP: 650.0000 mg | Freq: Four times a day (QID) | RECTAL | Status: DC | PRN

## 2021-07-18 MED ORDER — DOXYCYCLINE HYCLATE 100 MG PO TABS
100.0000 mg | ORAL_TABLET | Freq: Two times a day (BID) | ORAL | Status: DC
  Administered 2021-07-18 – 2021-07-19 (×3): 100 mg via ORAL
  Filled 2021-07-18 (×3): qty 1

## 2021-07-18 MED ORDER — HEPARIN SODIUM (PORCINE) 5000 UNIT/ML IJ SOLN: 5000.0000 [IU] | Freq: Three times a day (TID) | INTRAMUSCULAR | Status: DC

## 2021-07-18 NOTE — Consult Note (Signed)
[facility] ? ?Patient ID: Stephen Byrd, male   DOB: [birth year], 21 y.o.   MRN: [id number] ?Consult in hospital ?[facility] ?Requested by Ramiro Harvest, MD ?Reason for consultation possible septic arthritis right knee and left ankle ? ?I recommend continue medical management or IV ceftriaxone followed by p.o. doxycycline if ID agrees ? ? ? ?Chief complaint is right knee and left ankle pain ? ?HPI ?Stephen Byrd is a 21 y.o. male.  He was treated for gonorrhea and did not pick up the p.o. doxycycline came back to the emergency room with knee and ankle swelling.  The emergency room tapped the right knee and found 5000 white cells and less than 90% neutrophils.  Blood cultures were sent off for incubation with chocolate auger patient in the right knee and left ankle pain which has subsequently improved with IV antibiotics ? ?Review of Systems (at least 2) ?ROS ? ?1.  Denies fever chills ?2.  Denies skin rash ? ? ? has a past medical history of ADHD (attention deficit hyperactivity disorder), Bipolar 1 disorder (HCC), and Heart murmur.   ? ?Past Surgical History:  ?Procedure Laterality Date  ? ACROMIO-CLAVICULAR JOINT REPAIR Left 02/13/2018  ? Procedure: LEFT SHOULDER ACROMIOCLAVICULAR RECONSTRUCTION;  Surgeon: Cammy Copa, MD;  [facility] OR;  Service: Orthopedics;  Laterality: Left;  ? NO PAST SURGERIES    ?  ? ?Family History  ?Problem Relation Age of Onset  ? Hypertension Mother   ? Hypertension Maternal Grandfather   ? ? ?Social History  ? ?Tobacco Use  ? Smoking status: Never  ? Smokeless tobacco: Never  ?Vaping Use  ? Vaping Use: Never used  ?Substance Use Topics  ? Alcohol use: No  ? Drug use: Yes  ?  Types: Marijuana  ? ? ?No Known Allergies ? ? ?Current Facility-Administered Medications:  ?  acetaminophen (TYLENOL) tablet 650 mg, 650 mg, Oral, Q6H PRN **OR** acetaminophen (TYLENOL) suppository 650 mg, 650 mg, Rectal, Q6H PRN, Zierle-Ghosh, Asia B, DO ?  cefTRIAXone (ROCEPHIN) 2 g  in sodium chloride 0.9 % 100 mL IVPB, 2 g, Intravenous, Q24H, Zierle-Ghosh, Asia B, DO, Last Rate: 200 mL/hr at 07/18/21 2130, 2 g at 07/18/21 2130 ?  doxycycline (VIBRA-TABS) tablet 100 mg, 100 mg, Oral, Q12H, Rodolph Bong, MD, 100 mg at 07/18/21 2131 ?  enoxaparin (LOVENOX) injection 40 mg, 40 mg, Subcutaneous, Q24H, Vickki Hearing, MD ?  ketorolac (TORADOL) 30 MG/ML injection 30 mg, 30 mg, Intravenous, Q6H PRN, Rodolph Bong, MD ?  ondansetron [facility]) tablet 4 mg, 4 mg, Oral, Q6H PRN **OR** ondansetron (ZOFRAN) injection 4 mg, 4 mg, Intravenous, Q6H PRN, Zierle-Ghosh, Asia B, DO ?  oxyCODONE (Oxy IR/ROXICODONE) immediate release tablet 5 mg, 5 mg, Oral, Q4H PRN, Zierle-Ghosh, Asia B, DO ?  polyethylene glycol (MIRALAX / GLYCOLAX) packet 17 g, 17 g, Oral, Daily PRN, Zierle-Ghosh, Asia B, DO  ? ? ? ?Physical Exam-Detailed ?Physical Exam ? ?BP 105/74 (BP Location: Left Arm)   Pulse (!) 56   Temp 98 ?F (36.7 ?C) (Oral)   Resp 18   Ht 5\' 7"  (1.702 m)   Wt 72.6 kg   SpO2 100%   BMI 25.06 kg/m?  ?Body mass index is 25.06 kg/m?. ? ?Gen. Appearance normal no congenital abnormalities ?Cardiovascular exam the pulses are 2+ with no peripheral edema ? ?The ambulatory status is slight limp favoring the right leg ? ?Extremity examined right knee no appreciable swelling mild tenderness primarily over the tap site slight  warmth no erythema no skin rash no instability ?Active range of motion pain-free for 20 degrees of extension past 90 degrees flexion ? ?I ?Sensation to touch is normal ?The patient is oriented to person place and time ?The patient's mood and affect show no depression or anxiety or agitation ? ?Opposite extremity :  ? ?Left: Ankle ?Inspection was normal  ?Range of motion assessment full range of motion as recorded ?Stability assessment stability test reveal no instability or laxity ?Muscle strength and muscle tone are normal with no atrophy or tremors ?Skin there are no scars rashes lesions  or lacerations ? ? ?MEDICAL DECISION MAKING (minimum/low) ? ?Data Reviewed ?Sed rate 30 White count normal CRP 2.4 ?Assessment and Plan  ?Continue IV antibiotics ? ?Patient should be able to be discharged Monday on appropriate antibiotics p.o. and then follow-up in a week after in the office ? ?Patient was counseled on appropriate protection for sexual activity ? ?Fuller Canada ?07/18/2021, 10:56 PM ? ? ?Vickki Hearing MD  ?

## 2021-07-18 NOTE — Assessment & Plan Note (Addendum)
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  ? - recently diagnosed, per patient 3 days prior to admission in the ED/hospital. ?Right knee and left ankle swollen ?Knee aspirated in ED - sent for culture with results pending at time of discharge. ?Patient reported on admission right ankle pain is becoming worse, leg shakes with weight bearing because of ankle - ankle X ray ordered. ?-Patient placed on IV Rocephin as well as doxycycline with clinical improvement with right knee swelling and pain as well as left ankle swelling and pain. ?-Urine GC/chlamydia was ordered and Neisseria gonorrhea noted to be positive (07/18/2021) also noted to have been positive on 07/15/2021. ?-HIV obtained was nonreactive, acute hepatitis panel obtained per ID was negative. ?-RPR pending at time of discharge. ?-Called and spoke with microbiology bench and asked lab to set up chocolate plate for joint culture which will need to be followed up in the outpatient setting. ?-Discussed with ID who are recommended 7 days of IV Rocephin as well as 7 days of doxycycline with outpatient follow-up with ID. ?-Midline was placed and home health orders placed for outpatient IV antibiotics. ?-Patient during the hospitalization was maintained on IV Rocephin and doxycycline. ?-Orthopedics consulted, patient seen in consultation by Dr. Romeo Apple who is recommending continuation of antibiotics with close outpatient follow-up on 07/26/2021. ?-Patient seen by PT OT. ?-Patient will follow-up with infectious disease in the outpatient setting on 07/26/2021 at 10:45 AM. ?-Patient was discharged in stable and improved condition. ? ?

## 2021-07-18 NOTE — Progress Notes (Signed)
Pt admitted for infected joint pain. BP 107/75 (BP Location: Right Arm)   Pulse 60   Temp 97.8 ?F (36.6 ?C) (Oral)   Resp 16   Ht 5\' 7"  (1.702 m)   Wt 72.6 kg   SpO2 99%   BMI 25.06 kg/m?  ?Pt A&OX4, knee pain of approximately 5. Given scheduled Toradol with some relief. No skin issues, pt started on IV antibiotics. Bed is in lowest position, 2/4 rails up, call bell nearby and brakes set on bed. ?No further needs voiced at this time. ? ?07/18/21 ?07/20/21 ? ?

## 2021-07-18 NOTE — Hospital Course (Signed)
Patient is a 21 year old gentleman history of substance abuse, recently diagnosed with gonorrhea per patient 3 days prior to admission presented to the ED with chief complaint of right knee and left ankle pain.  Patient stated on diagnosis of gonorrhea received IM Rocephin and was prescribed oral doxycycline which she has yet to pick up.  Patient noted in the ED to have right knee swelling, pain, difficulty extending right knee without pain, pain in joints with weightbearing.  Patient denies any recent trauma to joints.  Patient seen in the ED right knee aspirated per ED physician.  Patient placed on IV Rocephin.  Will start oral doxycycline.  Orthopedics consulted. ?

## 2021-07-18 NOTE — Assessment & Plan Note (Addendum)
Reports use of 1oz marijuana daily ?Counseled on importance of cessations ?UDS positive for THC. ?

## 2021-07-18 NOTE — Progress Notes (Signed)
PHARMACY CONSULT NOTE FOR: ? ?OUTPATIENT  PARENTERAL ANTIBIOTIC THERAPY (OPAT) ? ?Indication: disseminated gonorrhea ?Regimen: Ceftriaxone 1000 mg IV every 24 hours. ?End date: Last day of therapy 07/23/21 ? ?IV antibiotic discharge orders are pended. ?To discharging provider:  please sign these orders via discharge navigator,  ?Select New Orders & click on the button choice - Manage This Unsigned Work.  ?  ? ?Thank you for allowing pharmacy to be a part of this patient's care. ? ?Tad Moore ?07/18/2021, 5:28 PM ?

## 2021-07-18 NOTE — H&P (Signed)
?History and Physical  ? ? ?Patient: Stephen Byrd ZVJ:[id number] DOB: 03-07-2001 ?DOA: 07/17/2021 ?DOS: the patient was seen and examined on 07/18/2021 ?PCP: Pcp, No  ?Patient coming from: Home ? ?Chief Complaint: No chief complaint on file. ? ?HPI: Stephen Byrd is a 21 y.o. male with medical history significant of with history of substance abuse, and recently diagnosed with gonorrhea, presents ED with a chief complaint of right knee and left ankle pain.  Patient reports that both joints started hurting about 2 or 3 days ago.  It started with mild swelling but has been progressively worse since then.  He has had no fevers.  He denies trauma to either joint.  He has not noticed drainage from either drain at home.  Both joints are worse with weightbearing.  He has never had anything like this before.  Patient reports when he was recently diagnosed with gonorrhea he was given a shot of Rocephin IM.  He never went to the pharmacy to pick up his prescription for doxycycline.  Patient has no other complaints at this time. ? ?Patient does not smoke cigarettes, does not drink alcohol.  He does smoke 1 ounce of marijuana daily.  He is vaccinated for COVID.  Patient is full code. ?Review of Systems: As mentioned in the history of present illness. All other systems reviewed and are negative. ?Past Medical History:  ?Diagnosis Date  ? ADHD (attention deficit hyperactivity disorder)   ? Bipolar 1 disorder ([place])   ? Heart murmur   ? ?Past Surgical History:  ?Procedure Laterality Date  ? ACROMIO-CLAVICULAR JOINT REPAIR Left 02/13/2018  ? Procedure: LEFT SHOULDER ACROMIOCLAVICULAR RECONSTRUCTION;  Surgeon: Meredith Pel, MD;  Location: [facility];  Service: Orthopedics;  Laterality: Left;  ? NO PAST SURGERIES    ? ?Social History:  reports that he has never smoked. He has never used smokeless tobacco. He reports current drug use. Drug: Marijuana. He reports that he does not drink alcohol. ? ?No Known Allergies ? ?Family History   ?Problem Relation Age of Onset  ? Hypertension Mother   ? Hypertension Maternal Grandfather   ? ? ?Prior to Admission medications   ?Medication Sig Start Date End Date Taking? Authorizing Provider  ?acetaminophen-codeine (TYLENOL #3) 300-30 MG tablet Take 1 tablet by mouth every 8 (eight) hours as needed for moderate pain. ?Patient not taking: No sig reported 04/10/18   Meredith Pel, MD  ?benzonatate (TESSALON) 100 MG capsule Take 1 capsule (100 mg total) by mouth every 8 (eight) hours. ?Patient not taking: No sig reported 04/06/18   Ashley Murrain, NP  ?diclofenac (VOLTAREN) 50 MG EC tablet Take 1 tablet (50 mg total) by mouth 2 (two) times daily. ?Patient not taking: No sig reported 06/23/19   Fransico Meadow, PA-C  ?doxycycline (VIBRAMYCIN) 100 MG capsule Take 1 capsule (100 mg total) by mouth 2 (two) times daily for 7 days. 07/15/21 07/22/21  Noemi Chapel, MD  ?HYDROcodone-acetaminophen (NORCO/VICODIN) 5-325 MG tablet 1 po q 6-8 hrs prn pain ?Patient not taking: No sig reported 02/21/18   Meredith Pel, MD  ?ibuprofen (ADVIL) 800 MG tablet Take 1 tablet (800 mg total) by mouth every 6 (six) hours as needed for moderate pain. ?Patient not taking: No sig reported 05/02/19   Orpah Greek, MD  ?lidocaine (LIDODERM) 5 % Place 1 patch onto the skin daily. Apply patch directly to left shoulder; Remove & Discard patch within 12 hours 12/30/18   Petrucelli, Glynda Jaeger, PA-C  ?  meloxicam (MOBIC) 15 MG tablet Take 1 tablet (15 mg total) by mouth daily. 02/17/21   Meredith Pel, MD  ?methocarbamol (ROBAXIN) 500 MG tablet Take 1 tablet (500 mg total) by mouth 2 (two) times daily. ?Patient not taking: No sig reported 06/23/19   Fransico Meadow, PA-C  ?methocarbamol (ROBAXIN) 500 MG tablet Take 1 tablet (500 mg total) by mouth every 8 (eight) hours as needed for muscle spasms. 02/17/21   Meredith Pel, MD  ?naproxen (NAPROSYN) 500 MG tablet Take 1 tablet (500 mg total) by mouth 2 (two) times  daily. ?Patient not taking: No sig reported 03/16/19   Petrucelli, Samantha R, PA-C  ?oseltamivir (TAMIFLU) 75 MG capsule Take 1 capsule (75 mg total) by mouth every 12 (twelve) hours. ?Patient not taking: No sig reported 04/06/18   Ashley Murrain, NP  ? ? ?Physical Exam: ?Vitals:  ? 07/17/21 1243 07/17/21 1811 07/17/21 2335 07/18/21 0204  ?BP:  124/76 125/78 107/75  ?Pulse:  61 (!) 52 60  ?Resp:  16    ?Temp:   98.3 ?F (36.8 ?C) 97.8 ?F (36.6 ?C)  ?TempSrc:   Oral Oral  ?SpO2:  100% 98% 99%  ?Weight: 72.6 kg     ?Height: 5' 7"  (1.702 m)     ? ?1.  General: ?Patient sitting on edge of bed,  no acute distress ?  ?2. Psychiatric: ?Alert and oriented x 3, mood and behavior normal for situation, pleasant and cooperative with exam ?  ?3. Neurologic: ?Speech and language are normal, face is symmetric, moves all 4 extremities voluntarily, at baseline without acute deficits on limited exam ?  ?4. HEENMT:  ?Head is atraumatic, normocephalic, pupils reactive to light, neck is supple, trachea is midline, mucous membranes are moist ?  ?5. Respiratory : ?Lungs are clear to auscultation bilaterally without wheezing, rhonchi, rales, no cyanosis, no increase in work of breathing or accessory muscle use ?  ?6. Cardiovascular : ?Heart rate normal, rhythm is regular, no murmurs, rubs or gallops, no peripheral edema, peripheral pulses palpated ?  ?7. Gastrointestinal:  ?Abdomen is soft, nondistended, nontender to palpation bowel sounds active, no masses or organomegaly palpated ?  ?8. Skin:  ?Skin is warm, dry and intact without rashes, acute lesions, or ulcers on limited exam ?  ?9.Musculoskeletal:  ?Right knee is erythematous and edematous, tender to palpation with bandage over the medial aspect where the joint was aspirated.  Left ankle is edematous, most tender around the medial malleolus, no drainage. ? ?Data Reviewed: ?In the ED ?Temp 98.6, heart rate 61-79, respiratory rate 16, blood pressure 124/76, satting at 100% ?No  leukocytosis White blood cell count of 8.5, hemoglobin 13.1 ?Chemistry panel is unremarkable ?ESR 30 ?UA indicative of UTI ?Synovial fluid shows 5460 white blood cells, 83 neutrophils, cloudy, and red ?Blood culture pending ?Body fluid culture pending ?Ortho was consulted and recommends admission with IV antibiotics and they will see in the a.m. ? ?Assessment and Plan: ?* Septic joint ([place]) ?Most likely related to ghonorrhea - recently diagnosed ?Right knee and left ankle swollen ?Knee aspirated in ED - sent for culture ?Patient reports right ankle pain is becoming worse, leg shakes with weight bearing because of ankle - ankle X ray ordered ?Toradol for pain ?Rocephin started ?Ortho consulted and plans to see patient tomorrow ?No leukocytosis ?Continue to monitor ? ? ?Gonorrhea ?Recently diagnosed and treated with a one time IM dose of rocephin ?Patient has not yet started doxycycline that was started for presumed  chlamydia ?Start doxy now ?Start Rocephin IV  ?UA is indicated of infection - with known gonorrhea ?Continue to monitor ? ?Substance use disorder ?Reports use of 1oz marijuana daily ?Counseled on importance of cessations ?UDS add-on  ?Continue to monitor ? ? ? ? ? Advance Care Planning:   Code Status: Full Code  ? ?Consults: Orthopedics ? ?Family Communication: No family at bedside ? ?Severity of Illness: ?The appropriate patient status for this patient is OBSERVATION. Observation status is judged to be reasonable and necessary in order to provide the required intensity of service to ensure the patient's safety. The patient's presenting symptoms, physical exam findings, and initial radiographic and laboratory data in the context of their medical condition is felt to place them at decreased risk for further clinical deterioration. Furthermore, it is anticipated that the patient will be medically stable for discharge from the hospital within 2 midnights of admission.  ? ?Author: ?Rolla Plate,  DO ?07/18/2021 3:26 AM ? ?For on call review [email or web address].  ?

## 2021-07-18 NOTE — Assessment & Plan Note (Addendum)
Recently diagnosed and treated with a one time IM dose of rocephin per patient in the ED. ?Patient had not yet started doxycycline that was started for presumed chlamydia. ?-Urine GC/chlamydia probe obtained during the hospitalization which was positive for gonorrhea. ?-Patient maintained on doxycycline IV Rocephin during the hospitalization. ?-ID curb sided due to concern for disseminated gonococcal infection who had recommended 7-day course of IV Rocephin as well as doxycycline with outpatient follow-up. ?-See septic joint above. ?

## 2021-07-18 NOTE — Progress Notes (Addendum)
?PROGRESS NOTE ? ? ? ?Stephen Byrd  [id number] DOB: Dec 12, 2000 DOA: 07/17/2021 ?PCP: Pcp, No  ? ? ?No chief complaint on file. ? ? ?Brief Narrative:  ?Patient is a 21 year old gentleman history of substance abuse, recently diagnosed with gonorrhea per patient 3 days prior to admission presented to the ED with chief complaint of right knee and left ankle pain.  Patient stated on diagnosis of gonorrhea received IM Rocephin and was prescribed oral doxycycline which she has yet to pick up.  Patient noted in the ED to have right knee swelling, pain, difficulty extending right knee without pain, pain in joints with weightbearing.  Patient denies any recent trauma to joints.  Patient seen in the ED right knee aspirated per ED physician.  Patient placed on IV Rocephin.  Will start oral doxycycline.  Orthopedics consulted.  ? ? ?Assessment & Plan: ? Principal Problem: ?  Septic joint ([place]) ?Active Problems: ?  Substance use disorder ?  Gonorrhea ? ? ? ?Assessment and Plan: ?* Septic joint ([place]) ?Most likely related to ghonorrhea - recently diagnosed, per patient 3 days prior to admission in the ED/hospital. ?Right knee and left ankle swollen ?Knee aspirated in ED - sent for culture ?Patient reports right ankle pain is becoming worse, leg shakes with weight bearing because of ankle - ankle X ray ordered. ?-Improvement with left ankle pain. ?-Still with significant pain in the right knee with weightbearing. ?-Check a urine GC/chlamydia probe. ?-We will see whether we can add a GC chlamydia probe to joint knee aspirate. ?-Discussed with ID who are recommending 10 days of IV Rocephin as well as 7 days of doxycycline with outpatient follow-up with ID. ?-Continue IV Rocephin. ?-Start oral doxycycline. ?-Change Toradol to as needed. ?-Orthopedics consulted, patient seen in consultation by Dr. Aline Brochure who is recommending continuation of antibiotics with close outpatient follow-up. ?-Continue IV antibiotics for another 24 hours  and if continued improvement could potentially discharge to complete a 10-day course of IV Rocephin and 7-day course of oral doxycycline per ID.   ?-PT/OT. ? ? ?Gonorrhea ?Recently diagnosed and treated with a one time IM dose of rocephin ?Patient has not yet started doxycycline that was started for presumed chlamydia. ?-Check a urine GC/chlamydia probe. ?Place on doxycycline. ?-Continue IV Rocephin. ?UA is indicated of infection - with known gonorrhea ?-Urine cultures pending. ?Continue to monitor ? ?Substance use disorder ?Reports use of 1oz marijuana daily ?Counseled on importance of cessations ?UDS positive for THC. ?Continue to monitor ? ? ? ? ?  ? ? ?DVT prophylaxis: Lovenox ?Code Status: Full ?Family Communication: Updated patient and fianc? at bedside ?Disposition: Home hopefully tomorrow if continued clinical improvement. ? ?Status is: Inpatient ?The patient will require care spanning > 2 midnights and should be moved to inpatient because: Severity of illness, compliance with antibiotics, currently on IV antibiotics. ?  ?Consultants:  ?Orthopedics: Dr. Aline Brochure 07/18/2021 ? ?Procedures:  ?Right knee aspiration by EDP 07/17/2021 ? ?Antimicrobials:  ?IV Rocephin 07/18/2021>>>> ?Oral doxycycline 07/18/2021>>. ? ? ?Subjective: ?Sitting up at the side of the bed.  Hungry.  Denies any chest pain.  No shortness of breath.  No abdominal pain.  Left ankle swelling and pain feels has improved.  Still has right knee pain with difficulty extending the right lower extremity palpation.  Asking when he is going to be able to go home.  States he was diagnosed with gonorrhea in the ED 3 days ago and received a IM Rocephin shot.  States he was prescribed doxycycline  however has not picked it up yet. ? ?Objective: ?Vitals:  ? 07/18/21 0204 07/18/21 [id number] 07/18/21 1454 07/18/21 1541  ?BP: 107/75 104/61 (!) 159/123 (!) 122/95  ?Pulse: 60 (!) 46 (!) 47 64  ?Resp: 16 16 18    ?Temp: 97.8 ?F (36.6 ?C) 97.7 ?F (36.5 ?C) 97.9 ?F (36.6 ?C)    ?TempSrc: Oral Oral Oral   ?SpO2: 99% 100% 100%   ?Weight:      ?Height:      ? ? ?Intake/Output Summary (Last 24 hours) at 07/18/2021 1654 ?Last data filed at 07/18/2021 1300 ?Gross per 24 hour  ?Intake 480 ml  ?Output 850 ml  ?Net -370 ml  ? ?Filed Weights  ? 07/17/21 1243  ?Weight: 72.6 kg  ? ? ?Examination: ? ?General exam: Appears calm and comfortable  ?Respiratory system: Clear to auscultation. Respiratory effort normal. ?Cardiovascular system: S1 & S2 heard, RRR. No JVD, murmurs, rubs, gallops or clicks. No pedal edema. ?Gastrointestinal system: Abdomen is nondistended, soft and nontender. No organomegaly or masses felt. Normal bowel sounds heard. ?Central nervous system: Alert and oriented. No focal neurological deficits. ?Extremities: Right knee effusion, tender to palpation, tender to extension.  Left ankle less tender to palpation less swollen.  Symmetric 5 x 5 power. ?Skin: No rashes, lesions or ulcers ?Psychiatry: Judgement and insight appear normal. Mood & affect appropriate.  ? ? ? ?Data Reviewed:  ? ?CBC: ?Recent Labs  ?Lab 07/17/21 ?1512 07/18/21 ?0709  ?WBC 8.5 7.8  ?NEUTROABS 4.5 3.8  ?HGB 13.1 11.7*  ?HCT 41.0 36.3*  ?MCV 85.2 83.8  ?PLT 274 273  ? ? ?Basic Metabolic Panel: ?Recent Labs  ?Lab 07/17/21 ?1512 07/18/21 ?0709  ?NA 137 137  ?K 4.1 4.1  ?CL 100 103  ?CO2 27 25  ?GLUCOSE 78 88  ?BUN 14 13  ?CREATININE 0.97 0.83  ?CALCIUM 9.1 9.0  ?MG  --  2.0  ? ? ?GFR: ?Estimated Creatinine Clearance: 130.5 mL/min (by C-G formula based on SCr of 0.83 mg/dL). ? ?Liver Function Tests: ?Recent Labs  ?Lab 07/18/21 ?0709  ?AST 25  ?ALT 36  ?ALKPHOS 84  ?BILITOT 0.5  ?PROT 7.3  ?ALBUMIN 3.6  ? ? ?CBG: ?No results for input(s): GLUCAP in the last 168 hours. ? ? ?Recent Results (from the past 240 hour(s))  ?Body fluid culture w Gram Stain     Status: None (Preliminary result)  ? Collection Time: 07/17/21  3:06 PM  ? Specimen: KNEE  ?Result Value Ref Range Status  ? Specimen Description   Final  ?   KNEE ?Performed at [facility], [address], [place], [place] [address] ?  ? Special Requests   Final  ?  NONE RIGHT KNEE ?Performed at [facility], [address] [place], [place] [address] ?  ? Gram Stain   Final  ?  FEW WBC PRESENT, PREDOMINANTLY PMN ?NO ORGANISMS SEEN ?  ? Culture   Final  ?  NO GROWTH < 12 HOURS ?Performed at [facility], [place] [address], [place], [place] [address] ?  ? Report Status PENDING  Incomplete  ?Culture, blood (routine x 2)     Status: None (Preliminary result)  ? Collection Time: 07/17/21  3:12 PM  ? Specimen: Left Antecubital; Blood  ?Result Value Ref Range Status  ? Specimen Description   Final  ?  LEFT ANTECUBITAL BOTTLES DRAWN AEROBIC AND ANAEROBIC  ? Special Requests Blood Culture adequate volume  Final  ? Culture   Final  ?  NO GROWTH < 24 HOURS ?Performed at [facility], [address], [place], [place] [address] ?  ? Report Status PENDING  Incomplete  ?Culture, blood (routine x 2)     Status: None (Preliminary result)  ? Collection Time: 07/17/21  3:15 PM  ? Specimen: Right Antecubital; Blood  ?Result Value Ref Range Status  ? Specimen Description   Final  ?  RIGHT ANTECUBITAL BOTTLES DRAWN AEROBIC AND ANAEROBIC  ? Special Requests   Final  ?  Blood Culture results may not be optimal due to an excessive volume of blood received in culture bottles  ? Culture   Final  ?  NO GROWTH < 24 HOURS ?Performed at [facility], [address] [place], [place] [address] ?  ? Report Status PENDING  Incomplete  ?Culture, blood (single)     Status: None (Preliminary result)  ? Collection Time: 07/17/21  9:00 PM  ? Specimen: BLOOD RIGHT FOREARM  ?Result Value Ref Range Status  ? Specimen Description   Final  ?  BLOOD RIGHT FOREARM BOTTLES DRAWN AEROBIC AND ANAEROBIC  ? Special Requests Blood Culture adequate volume  Final  ? Culture   Final  ?  NO GROWTH < 24 HOURS ?Performed at [facility], [address], [place], [place] [address] ?  ? Report Status PENDING  Incomplete  ?Resp  Panel by RT-PCR (Flu A&B, Covid) Nasopharyngeal Swab     Status: None  ? Collection Time: 07/18/21  9:13 AM  ? Specimen: Nasopharyngeal Swab; Nasopharyngeal(NP) swabs in vial transport medium  ?Result Value Ref Ran

## 2021-07-18 NOTE — Progress Notes (Signed)
Id brief note ? ? ?Called by dr Janee Morn for concern of disseminated gonococcal infection ? ?Patient is a young high risk male with recent unprotected course and urethritis seen in ed given ceftriaxone and rx for doxy which he had not done ? ?Returned to [facility] 3/18 with left ankle and right knee pain/swelling ?Afebrile no leukocytosis ? ?Right knee aspirate cloudy, 5.5k wbc 83% neutrophil, no crystal ?3/18 bcx ngtd ? ?3/19 urine gc/chlam ordered per primary team ? ?Ortho no planned intervention ? ? ?Both ceftriaxone/doxy started ? ? ?-clinically concerned for disseminated gonococcal infection. No prior gc/chlam testing ?-responds promptly this time to ceftriaxone ?-lack of antibiotics susceptibility testing and high rate of drug resistance make ceftriaxone only reliable agent to treat gonorrhea ? ?-would do 7 days ceftriaxone iv via midline ?-finish 7 days doxy ? ?-agree urine gc/chlam testing; dr Janee Morn asked lab to setup chocolate plate for the joint culture ?-hiv/hepatitis/rpr screen ? ? ?-id follow up with me on 3/27 @ 1045 at [facility] ? ?OPAT Order Details   ? ?       ?  .Outpatient Parenteral Antibiotic Therapy Consult  Until discontinued       ?Comments: 1 gram iv daily  ?Provider:  (Not yet assigned)  ?Question Answer Comment  ?Antibiotic Ceftriaxone (Rocephin) IVPB   ?Indications for use disseminated gonorrhea   ?End Date 07/24/2021   ?  ?  ? ?  ?  ? ?  ? ? ?

## 2021-07-18 NOTE — Plan of Care (Signed)

## 2021-07-19 DIAGNOSIS — M00861 Arthritis due to other bacteria, right knee: Secondary | ICD-10-CM | POA: Diagnosis not present

## 2021-07-19 DIAGNOSIS — A549 Gonococcal infection, unspecified: Secondary | ICD-10-CM | POA: Diagnosis not present

## 2021-07-19 LAB — HEPATITIS PANEL, ACUTE
HCV Ab: NONREACTIVE
Hep A IgM: NONREACTIVE
Hep B C IgM: NONREACTIVE
Hepatitis B Surface Ag: NONREACTIVE

## 2021-07-19 LAB — CBC WITH DIFFERENTIAL/PLATELET
Abs Immature Granulocytes: 0.03 10*3/uL (ref 0.00–0.07)
Basophils Absolute: 0.1 10*3/uL (ref 0.0–0.1)
Basophils Relative: 1 %
Eosinophils Absolute: 0.2 10*3/uL (ref 0.0–0.5)
Eosinophils Relative: 3 %
HCT: 36.7 % — ABNORMAL LOW (ref 39.0–52.0)
Hemoglobin: 12 g/dL — ABNORMAL LOW (ref 13.0–17.0)
Immature Granulocytes: 0 %
Lymphocytes Relative: 27 %
Lymphs Abs: 2.2 10*3/uL (ref 0.7–4.0)
MCH: 27.7 pg (ref 26.0–34.0)
MCHC: 32.7 g/dL (ref 30.0–36.0)
MCV: 84.8 fL (ref 80.0–100.0)
Monocytes Absolute: 0.9 10*3/uL (ref 0.1–1.0)
Monocytes Relative: 11 %
Neutro Abs: 4.7 10*3/uL (ref 1.7–7.7)
Neutrophils Relative %: 58 %
Platelets: 312 10*3/uL (ref 150–400)
RBC: 4.33 MIL/uL (ref 4.22–5.81)
RDW: 13.4 % (ref 11.5–15.5)
WBC: 8.1 10*3/uL (ref 4.0–10.5)
nRBC: 0 % (ref 0.0–0.2)

## 2021-07-19 LAB — GC/CHLAMYDIA PROBE AMP (~~LOC~~) NOT AT ARMC
Chlamydia: NEGATIVE
Chlamydia: NEGATIVE
Comment: NEGATIVE
Comment: NORMAL
Comment: NEGATIVE
Comment: NORMAL
Neisseria Gonorrhea: POSITIVE — AB
Neisseria Gonorrhea: POSITIVE — AB

## 2021-07-19 LAB — BASIC METABOLIC PANEL
Anion gap: 9 (ref 5–15)
BUN: 14 mg/dL (ref 6–20)
CO2: 26 mmol/L (ref 22–32)
Calcium: 9.1 mg/dL (ref 8.9–10.3)
Chloride: 102 mmol/L (ref 98–111)
Creatinine, Ser: 0.97 mg/dL (ref 0.61–1.24)
GFR, Estimated: >60 mL/min (ref >60)
Glucose, Bld: 88 mg/dL (ref 70–99)
Potassium: 4.2 mmol/L (ref 3.5–5.1)
Sodium: 137 mmol/L (ref 135–145)

## 2021-07-19 MED ORDER — SODIUM CHLORIDE 0.9% FLUSH: 10.0000 mL | INTRAVENOUS | Status: DC | PRN

## 2021-07-19 MED ORDER — DOXYCYCLINE HYCLATE 100 MG PO TABS: 100.0000 mg | ORAL_TABLET | Freq: Two times a day (BID) | ORAL | 0 refills | Status: AC

## 2021-07-19 MED ORDER — TRAMADOL HCL 50 MG PO TABS: 50.0000 mg | ORAL_TABLET | Freq: Four times a day (QID) | ORAL | 0 refills | Status: AC | PRN

## 2021-07-19 MED ORDER — ACETAMINOPHEN 325 MG PO TABS: 650.0000 mg | ORAL_TABLET | Freq: Four times a day (QID) | ORAL | Status: DC | PRN

## 2021-07-19 MED ORDER — SODIUM CHLORIDE 0.9% FLUSH: 10.0000 mL | Freq: Two times a day (BID) | INTRAVENOUS | Status: DC

## 2021-07-19 MED ORDER — CEFTRIAXONE IV (FOR PTA / DISCHARGE USE ONLY): 1.0000 g | INTRAVENOUS | 0 refills | Status: AC

## 2021-07-19 NOTE — Progress Notes (Signed)
Patient ID: Stephen Byrd, male   DOB: 2001/03/22, 21 y.o.   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 ?Patient continues to improve ? ?Patient should be eligible to go home with 5 days of IV antibiotics per medical service and ID ? ?Follow-up with me in 1 week ? ?Patient requested a brace for his right knee.  We do not have a lot of options at the hospital so I will have physical therapy show him what we have and let him pick one of the braces. ?

## 2021-07-19 NOTE — Progress Notes (Signed)
Patient set up with [facility] for IV abx. The will start care 07/20/21 at 12:30 p.m. Patient will be at [address] [place], [place]. Fleet Contras with [facility] contacted patient on room phone and he is agreeable to services. Patient's oral abx remains at [facility] on [address]. Patient is agreeable to OPPT. Patient referred to OPPT in [place].  ? ?Cristine Daw, Juleen China, LCSW  ?

## 2021-07-19 NOTE — Progress Notes (Signed)
Reached out to IV for midline insertion. IV team will come as soon as possible.  ?

## 2021-07-19 NOTE — Evaluation (Signed)
Physical Therapy Evaluation ?Patient Details ?Name: Stephen Byrd ?MRN: [id number] ?DOB: Sep 17, 2000 ?Today's Date: 07/19/2021 ? ?History of Present Illness ? Stephen Byrd is a 21 y.o. male with medical history significant of with history of substance abuse, and recently diagnosed with gonorrhea, presents ED with a chief complaint of right knee and left ankle pain.  Patient reports that both joints started hurting about 2 or 3 days ago.  It started with mild swelling but has been progressively worse since then.  He has had no fevers.  He denies trauma to either joint.  He has not noticed drainage from either drain at home.  Both joints are worse with weightbearing.  He has never had anything like this before.  Patient reports when he was recently diagnosed with gonorrhea he was given a shot of Rocephin IM.  He never went to the pharmacy to pick up his prescription for doxycycline.  Patient has no other complaints at this time. ?  ?Clinical Impression ? Patient demonstrates good return for moving RLE during bed mobility, slightly labored cadence with limited right knee flexion/extension, antalgic gait on RLE due to increased pain when weightbearing  and good return for ambulation in room, hallway and on stairs.  Patient given ace wrap and applied to right knee to help reduce swelling.  PLAN:  Patient to be discharged home today and discharged from acute physical therapy to care of nursing for ambulation as tolerated for length of stay with recommendations stated below ? ?   ? ?Recommendations for follow up therapy are one component of a multi-disciplinary discharge planning process, led by the attending physician.  Recommendations may be updated based on patient status, additional functional criteria and insurance authorization. ? ?Follow Up Recommendations Outpatient PT ? ?  ?Assistance Recommended at Discharge PRN  ?Patient can return home with the following ? Help with stairs or ramp for entrance;Assistance with  cooking/housework;A little help with walking and/or transfers ? ?  ?Equipment Recommendations None recommended by PT  ?Recommendations for Other Services ?    ?  ?Functional Status Assessment Patient has had a recent decline in their functional status and demonstrates the ability to make significant improvements in function in a reasonable and predictable amount of time.  ? ?  ?Precautions / Restrictions Precautions ?Precautions: None ?Restrictions ?Weight Bearing Restrictions: No  ? ?  ? ?Mobility ? Bed Mobility ?Overal bed mobility: Independent ?  ?  ?  ?  ?  ?  ?  ?  ? ?Transfers ?Overall transfer level: Modified independent ?  ?  ?  ?  ?  ?  ?  ?  ?  ?  ? ?Ambulation/Gait ?Ambulation/Gait assistance: Modified independent (Device/Increase time) ?Gait Distance (Feet): 100 Feet ?Assistive device: None ?Gait Pattern/deviations: Decreased stance time - right, Decreased stride length, Decreased step length - right, Antalgic ?Gait velocity: decreased ?  ?  ?General Gait Details: slightly labored cadence with antalgic gait on RLE due to increasing knee pain when weightbearing, good return for amublation in room, hallway without loss of balancd ? ?Stairs ?Stairs: Yes ?Stairs assistance: Modified independent (Device/Increase time) ?Stair Management: One rail Right, Step to pattern ?Number of Stairs: 4 ?General stair comments: demonstrates good return for going up/down steps in stairwell with step to pattern using 1 siderail without loss of balance ? ?Wheelchair Mobility ?  ? ?Modified Rankin (Stroke Patients Only) ?  ? ?  ? ?Balance Overall balance assessment: Mild deficits observed, not formally tested ?  ?  ?  ?  ?  ?  ?  ?  ?  ?  ?  ?  ?  ?  ?  ?  ?  ?  ?   ? ? ? ?  Pertinent Vitals/Pain Pain Assessment ?Pain Assessment: Faces ?Faces Pain Scale: Hurts little more ?Pain Location: right knee ?Pain Descriptors / Indicators: Sore ?Pain Intervention(s): Limited activity within patient's tolerance, Monitored during session,  Repositioned  ? ? ?Home Living Family/patient expects to be discharged to:: Private residence ?Living Arrangements: Other relatives ?Available Help at Discharge: Family;Available 24 hours/day ?Type of Home: Mobile home ?Home Access: Stairs to enter ?Entrance Stairs-Rails: Right ?Entrance Stairs-Number of Steps: 3 ?  ?Home Layout: One level ?Home Equipment: Kasandra Knudsen - single point ?   ?  ?Prior Function Prior Level of Function : Independent/Modified Independent ?  ?  ?  ?  ?  ?  ?Mobility Comments: Hydrographic surveyor, drives ?ADLs Comments: Independent ?  ? ? ?Hand Dominance  ? Dominant Hand: Right ? ?  ?Extremity/Trunk Assessment  ? Upper Extremity Assessment ?Upper Extremity Assessment: Overall WFL for tasks assessed ?  ? ?Lower Extremity Assessment ?Lower Extremity Assessment: Overall WFL for tasks assessed;RLE deficits/detail ?RLE Deficits / Details: grossly -4/5 except right knee 3+/5 ?RLE: Unable to fully assess due to pain ?RLE Sensation: WNL ?RLE Coordination: WNL ?  ? ?Cervical / Trunk Assessment ?Cervical / Trunk Assessment: Normal  ?Communication  ? Communication: No difficulties  ?Cognition Arousal/Alertness: Awake/alert ?Behavior During Therapy: [facility] for tasks assessed/performed ?Overall Cognitive Status: Within Functional Limits for tasks assessed ?  ?  ?  ?  ?  ?  ?  ?  ?  ?  ?  ?  ?  ?  ?  ?  ?  ?  ?  ? ?  ?General Comments   ? ?  ?Exercises    ? ?Assessment/Plan  ?  ?PT Assessment All further PT needs can be met in the next venue of care  ?PT Problem List Decreased strength;Decreased activity tolerance;Decreased balance;Decreased mobility;Pain ? ?   ?  ?PT Treatment Interventions     ? ?PT Goals (Current goals can be found in the Care Plan section)  ?Acute Rehab PT Goals ?Patient Stated Goal: return home with family to assist ?PT Goal Formulation: With patient/family ?Time For Goal Achievement: 07/19/21 ?Potential to Achieve Goals: Good ? ?  ?Frequency   ?  ? ? ?Co-evaluation   ?  ?  ?  ?  ? ? ?  ?AM-PAC  PT "6 Clicks" Mobility  ?Outcome Measure Help needed turning from your back to your side while in a flat bed without using bedrails?: None ?Help needed moving from lying on your back to sitting on the side of a flat bed without using bedrails?: None ?Help needed moving to and from a bed to a chair (including a wheelchair)?: None ?Help needed standing up from a chair using your arms (e.g., wheelchair or bedside chair)?: None ?Help needed to walk in hospital room?: A Little ?Help needed climbing 3-5 steps with a railing? : A Little ?6 Click Score: 22 ? ?  ?End of Session   ?Activity Tolerance: Patient tolerated treatment well ?Patient left: in bed;with call bell/phone within reach;with family/visitor present ?Nurse Communication: Mobility status ?PT Visit Diagnosis: Unsteadiness on feet (R26.81);Other abnormalities of gait and mobility (R26.89);Muscle weakness (generalized) (M62.81) ?  ? ?Time: 4098-1191 ?PT Time Calculation (min) (ACUTE ONLY): 28 min ? ? ?Charges:   PT Evaluation ?$PT Eval Moderate Complexity: 1 Mod ?PT Treatments ?$Therapeutic Activity: 23-37 mins ?  ?   ? ? ?12:27 PM, 07/19/21 ?Lonell Grandchild, MPT ?Physical Therapist with [place] ?[facility] ?[phone] office ?[phone] mobile phone ? ? ?

## 2021-07-19 NOTE — Progress Notes (Signed)
Nsg Discharge Note ? ?Admit Date:  07/17/2021 ?Discharge date: 07/19/2021 ?  ?Stephen Byrd to be D/C'd Home per MD order.  AVS completed.  Copy for chart, and copy for patient signed, and dated. ?Patient/caregiver able to verbalize understanding. ? ?Discharge Medication: ?Allergies as of 07/19/2021   ?No Known Allergies ?  ? ?  ?Medication List  ?  ? ?STOP taking these medications   ? ?acetaminophen-codeine 300-30 MG tablet ?Commonly known as: TYLENOL #3 ?  ?benzonatate 100 MG capsule ?Commonly known as: TESSALON ?  ?diclofenac 50 MG EC tablet ?Commonly known as: VOLTAREN ?  ?doxycycline 100 MG capsule ?Commonly known as: VIBRAMYCIN ?Replaced by: doxycycline 100 MG tablet ?  ?HYDROcodone-acetaminophen 5-325 MG tablet ?Commonly known as: NORCO/VICODIN ?  ?ibuprofen 800 MG tablet ?Commonly known as: ADVIL ?  ?lidocaine 5 % ?Commonly known as: Lidoderm ?  ?methocarbamol 500 MG tablet ?Commonly known as: ROBAXIN ?  ?oseltamivir 75 MG capsule ?Commonly known as: TAMIFLU ?  ? ?  ? ?TAKE these medications   ? ?acetaminophen 325 MG tablet ?Commonly known as: TYLENOL ?Take 2 tablets (650 mg total) by mouth every 6 (six) hours as needed for mild pain (or Fever >/= 101). ?  ?cefTRIAXone  IVPB ?Commonly known as: ROCEPHIN ?Inject 1 g into the vein daily for 5 days. Indication:  disseminated gonorrhea ?First Dose: Yes ?Last Day of Therapy:  07/23/21 ?Labs - Once weekly:  CBC/D and BMP, ?Labs - Every other week:  ESR and CRP ?Method of administration: IV Push ?Method of administration may be changed at the discretion of home infusion pharmacist based upon assessment of the patient and/or caregiver's ability to self-administer the medication ordered. ?  ?doxycycline 100 MG tablet ?Commonly known as: VIBRA-TABS ?Take 1 tablet (100 mg total) by mouth every 12 (twelve) hours for 6 days. ?Replaces: doxycycline 100 MG capsule ?  ?meloxicam 15 MG tablet ?Commonly known as: Mobic ?Take 1 tablet (15 mg total) by mouth daily. ?  ?naproxen 500  MG tablet ?Commonly known as: NAPROSYN ?Take 1 tablet (500 mg total) by mouth 2 (two) times daily. ?  ?traMADol 50 MG tablet ?Commonly known as: Ultram ?Take 1 tablet (50 mg total) by mouth every 6 (six) hours as needed. ?  ? ?  ? ?  ?  ? ? ?  ?Discharge Care Instructions  ?(From admission, onward)  ?  ? ? ?  ? ?  Start     Ordered  ? 07/19/21 0000  Change dressing on IV access line weekly and PRN  (Home infusion instructions - [facility] )       ? 07/19/21 1140  ? ?  ?  ? ?  ? ? ?Discharge Assessment: ?Vitals:  ? 07/19/21 0639 07/19/21 1442  ?BP: 123/71 (!) 109/92  ?Pulse: 62 (!) 108  ?Resp: 18 20  ?Temp: 98.7 ?F (37.1 ?C) 98.2 ?F (36.8 ?C)  ?SpO2: 100% 95%  ? Skin clean, dry and intact without evidence of skin break down, no evidence of skin tears noted. ?IV catheter discontinued intact. Site without signs and symptoms of complications - no redness or edema noted at insertion site, patient denies c/o pain - only slight tenderness at site.  Dressing with slight pressure applied. ? ?D/c Instructions-Education: ?Discharge instructions given to patient/family with verbalized understanding. ?D/c education completed with patient/family including follow up instructions, medication list, d/c activities limitations if indicated, with other d/c instructions as indicated by MD - patient able to verbalize understanding, all questions  fully answered. ?Patient instructed to return to ED, call 911, or call MD for any changes in condition.  ?Patient escorted via [place], and D/C home via private auto. ? ?Clovis Fredrickson, LPN ?05/15/6429 4:27 PM  ?

## 2021-07-19 NOTE — Discharge Summary (Signed)
Physician Discharge Summary  ?Stephen Byrd RVI:[id number] DOB: 16-May-2000 DOA: 07/17/2021 ? ?PCP: Pcp, No ? ?Admit date: 07/17/2021 ?Discharge date: 07/19/2021 ? ?Time spent: 60 minutes ? ?Recommendations for Outpatient Follow-up:  ?Follow-up with Dr. Aline Brochure, orthopedics on 07/26/2021. ?Follow-up with Dr.Vu, infectious disease on 07/26/2021 at 10:45 AM. ? ? ?Discharge Diagnoses:  ?Principal Problem: ?  Septic joint ([place]) ?Active Problems: ?  Substance use disorder ?  Gonorrhea ? ? ?Discharge Condition: Stable and improved ? ?Diet recommendation: Regular ? ?Filed Weights  ? 07/17/21 1243  ?Weight: 72.6 kg  ? ? ?History of present illness:  ?HPI per Dr. Clearence Ped ?Stephen Byrd is a 21 y.o. male with medical history significant of with history of substance abuse, and recently diagnosed with gonorrhea, presents ED with a chief complaint of right knee and left ankle pain.  Patient reports that both joints started hurting about 2 or 3 days ago.  It started with mild swelling but has been progressively worse since then.  He has had no fevers.  He denies trauma to either joint.  He has not noticed drainage from either drain at home.  Both joints are worse with weightbearing.  He has never had anything like this before.  Patient reports when he was recently diagnosed with gonorrhea he was given a shot of Rocephin IM.  He never went to the pharmacy to pick up his prescription for doxycycline.  Patient has no other complaints at this time. ?  ?Patient does not smoke cigarettes, does not drink alcohol.  He does smoke 1 ounce of marijuana daily.  He is vaccinated for COVID.  Patient is full code. ?Hospital Course:  ? ?Assessment and Plan: ?* Septic joint ([place]) ?Most likely clinically concerning for disseminated gonococcal infection.  ? - recently diagnosed, per patient 3 days prior to admission in the ED/hospital. ?Right knee and left ankle swollen ?Knee aspirated in ED - sent for culture with results pending at time of  discharge. ?Patient reported on admission right ankle pain is becoming worse, leg shakes with weight bearing because of ankle - ankle X ray ordered. ?-Patient placed on IV Rocephin as well as doxycycline with clinical improvement with right knee swelling and pain as well as left ankle swelling and pain. ?-Urine GC/chlamydia was ordered and Neisseria gonorrhea noted to be positive (07/18/2021) also noted to have been positive on 07/15/2021. ?-HIV obtained was nonreactive, acute hepatitis panel obtained per ID was negative. ?-RPR pending at time of discharge. ?-Called and spoke with microbiology bench and asked lab to set up chocolate plate for joint culture which will need to be followed up in the outpatient setting. ?-Discussed with ID who are recommended 7 days of IV Rocephin as well as 7 days of doxycycline with outpatient follow-up with ID. ?-Midline was placed and home health orders placed for outpatient IV antibiotics. ?-Patient during the hospitalization was maintained on IV Rocephin and doxycycline. ?-Orthopedics consulted, patient seen in consultation by Dr. Aline Brochure who is recommending continuation of antibiotics with close outpatient follow-up on 07/26/2021. ?-Patient seen by PT OT. ?-Patient will follow-up with infectious disease in the outpatient setting on 07/26/2021 at 10:45 AM. ?-Patient was discharged in stable and improved condition. ? ? ?Gonorrhea ?Recently diagnosed and treated with a one time IM dose of rocephin per patient in the ED. ?Patient had not yet started doxycycline that was started for presumed chlamydia. ?-Urine GC/chlamydia probe obtained during the hospitalization which was positive for gonorrhea. ?-Patient maintained on doxycycline IV Rocephin during the  hospitalization. ?-ID curb sided due to concern for disseminated gonococcal infection who had recommended 7-day course of IV Rocephin as well as doxycycline with outpatient follow-up. ?-See septic joint above. ? ?Substance use  disorder ?Reports use of 1oz marijuana daily ?Counseled on importance of cessations ?UDS positive for THC. ? ? ? ? ?  ? ?Procedures: ?Right knee aspiration by EDP 07/17/2021 ?  ? ?Consultations: ?Orthopedics: Dr. Aline Brochure 07/18/2021 ?Curb sided ID: Dr.Vu 07/18/2021 ? ?Discharge Exam: ?Vitals:  ? 07/18/21 2218 07/19/21 5638  ?BP: 105/74 123/71  ?Pulse: (!) 56 62  ?Resp: 18 18  ?Temp: 98 ?F (36.7 ?C) 98.7 ?F (37.1 ?C)  ?SpO2: 100% 100%  ? ? ?General: NAD ?Cardiovascular: Regular rate rhythm no murmurs rubs or gallops.  No JVD.  No lower extremity edema ?Respiratory: Clear to auscultation bilaterally.  No wheezes, no crackles, no rhonchi.  Fair air movement. ? ?Discharge Instructions ? ? ?Discharge Instructions   ? ? [facility] pharmacist to adjust dose for Vancomycin, Aminoglycosides and other anti-infective therapies as requested by physician.   Complete by: As directed ?  ? [facility] to provide Cath Flo 84m   Complete by: As directed ?  ? Administer for PICC line occlusion and as ordered by physician for other access device issues.  ? Ambulatory referral to Physical Therapy   Complete by: As directed ?  ? Anaphylaxis Kit: Provided to treat any anaphylactic reaction to the medication being provided to the patient if First Dose or when requested by physician   Complete by: As directed ?  ? Epinephrine 164mml vial / amp: Administer 0.14m56m0.14ml54mubcutaneously once for moderate to severe anaphylaxis, nurse to call physician and pharmacy when reaction occurs and call 911 if needed for immediate care  ? Diphenhydramine 50mg50mIV vial: Administer 25-50mg 26mM PRN for first dose reaction, rash, itching, mild reaction, nurse to call physician and pharmacy when reaction occurs  ? Sodium Chloride 0.9% NS 500ml I40mdminister if needed for hypovolemic blood pressure drop or as ordered by physician after call to physician with anaphylactic reaction  ? Change dressing on IV access line weekly and PRN    Complete by: As directed ?  ? Diet general   Complete by: As directed ?  ? Flush IV access with Sodium Chloride 0.9% and Heparin 10 units/ml or 100 units/ml   Complete by: As directed ?  ? Home infusion instructions - [facility]   Complete by: As directed ?  ? Instructions: Flush IV access with Sodium Chloride 0.9% and Heparin 10units/ml or 100units/ml  ? Change dressing on IV access line: Weekly and PRN  ? Instructions Cath Flo 2mg: Ad38mister for PICC Line occlusion and as ordered by physician for other access device  ? [facility] pharmacist to adjust dose for: Vancomycin, Aminoglycosides and other anti-infective therapies as requested by physician  ? Increase activity slowly   Complete by: As directed ?  ? Method of administration may be changed at the discretion of home infusion pharmacist based upon assessment of the patient and/or caregiver?s ability to self-administer the medication ordered   Complete by: As directed ?  ? ?  ? ?Allergies as of 07/19/2021   ?No Known Allergies ?  ? ?  ?Medication List  ?  ? ?STOP taking these medications   ? ?acetaminophen-codeine 300-30 MG tablet ?Commonly known as: TYLENOL #3 ?  ?benzonatate 100 MG capsule ?Commonly known as: TESSALON ?  ?diclofenac 50 MG EC tablet ?Commonly  known as: VOLTAREN ?  ?doxycycline 100 MG capsule ?Commonly known as: VIBRAMYCIN ?Replaced by: doxycycline 100 MG tablet ?  ?HYDROcodone-acetaminophen 5-325 MG tablet ?Commonly known as: NORCO/VICODIN ?  ?ibuprofen 800 MG tablet ?Commonly known as: ADVIL ?  ?lidocaine 5 % ?Commonly known as: Lidoderm ?  ?methocarbamol 500 MG tablet ?Commonly known as: ROBAXIN ?  ?oseltamivir 75 MG capsule ?Commonly known as: TAMIFLU ?  ? ?  ? ?TAKE these medications   ? ?acetaminophen 325 MG tablet ?Commonly known as: TYLENOL ?Take 2 tablets (650 mg total) by mouth every 6 (six) hours as needed for mild pain (or Fever >/= 101). ?  ?cefTRIAXone  IVPB ?Commonly known as: ROCEPHIN ?Inject 1 g into the  vein daily for 5 days. Indication:  disseminated gonorrhea ?First Dose: Yes ?Last Day of Therapy:  07/23/21 ?Labs - Once weekly:  CBC/D and BMP, ?Labs - Every other week:  ESR and CRP ?Method of administration: IV Push

## 2021-07-19 NOTE — Progress Notes (Signed)

## 2021-07-20 ENCOUNTER — Telehealth: Payer: Self-pay

## 2021-07-20 DIAGNOSIS — R7881 Bacteremia: Secondary | ICD-10-CM | POA: Diagnosis not present

## 2021-07-20 LAB — GLUCOSE, BODY FLUID OTHER: Glucose, Body Fluid Other: 67 mg/dL

## 2021-07-20 LAB — URINE CULTURE: Culture: <10000 — AB

## 2021-07-20 LAB — RPR: RPR Ser Ql: NONREACTIVE

## 2021-07-20 NOTE — Telephone Encounter (Signed)
Transition Care Management Unsuccessful Follow-up Telephone Call ? ?Date of discharge and from where:  07/19/2021-[place]  ? ?Attempts:  1st Attempt ? ?Reason for unsuccessful TCM follow-up call:  Left voice message ? ?  ?

## 2021-07-20 NOTE — Telephone Encounter (Signed)
Transition Care Management Unsuccessful Follow-up Telephone Call ? ?Date of discharge and from where:  07/15/2021-[place]  ? ?Attempts:  2nd Attempt ? ?Reason for unsuccessful TCM follow-up call:  Unable to reach patient ? ?  ?

## 2021-07-21 NOTE — Telephone Encounter (Signed)
Transition Care Management Unsuccessful Follow-up Telephone Call ? ?Date of discharge and from where:  07/15/2021-[place]  ? ?Attempts:  3rd Attempt ? ?Reason for unsuccessful TCM follow-up call:  Unable to reach patient ? ?  ?

## 2021-07-21 NOTE — Telephone Encounter (Signed)
Transition Care Management Unsuccessful Follow-up Telephone Call ? ?Date of discharge and from where:  07/19/2021-[place] ? ?Attempts:  2nd Attempt ? ?Reason for unsuccessful TCM follow-up call:  Left voice message ? ?  ?

## 2021-07-21 NOTE — ED Provider Notes (Signed)
.  Joint Aspiration/Arthrocentesis ? ?Date/Time: 07/17/2021 3:00 PM ?Performed by: Godfrey Pick, MD ?Authorized by: Godfrey Pick, MD  ? ?Consent:  ?  Consent obtained:  Verbal ?  Consent given by:  Patient ?  Risks, benefits, and alternatives were discussed: yes   ?  Risks discussed:  Bleeding, infection and pain ?  Alternatives discussed:  No treatment and delayed treatment ?Universal protocol:  ?  Procedure explained and questions answered to patient or proxy's satisfaction: yes   ?  Patient identity confirmed:  Verbally with patient ?Location:  ?  Location:  Knee ?  Knee:  R knee ?Anesthesia:  ?  Anesthesia method:  Local infiltration ?  Local anesthetic:  Lidocaine 2% WITH epi ?Procedure details:  ?  Preparation: Patient was prepped and draped in usual sterile fashion   ?  Needle gauge:  18 G ?  Ultrasound guidance: Landmarks identified with ultrasound prior to procedure.   ?  Approach:  Medial ?  Aspirate amount:  10cc ?  Aspirate characteristics:  Bloody ?Post-procedure details:  ?  Dressing:  Adhesive bandage ?  Procedure completion:  Tolerated well, no immediate complications ? ?  ?Godfrey Pick, MD ?07/21/21 1812 ? ?

## 2021-07-22 LAB — CULTURE, BLOOD (SINGLE)
Culture: NO GROWTH
Special Requests: ADEQUATE

## 2021-07-22 LAB — CULTURE, BLOOD (ROUTINE X 2)
Culture: NO GROWTH
Culture: NO GROWTH
Special Requests: ADEQUATE

## 2021-07-22 NOTE — Telephone Encounter (Signed)
Transition Care Management Unsuccessful Follow-up Telephone Call ? ?Date of discharge and from where:  07/19/2021-[place]  ? ?Attempts:  3rd Attempt ? ?Reason for unsuccessful TCM follow-up call:  Left voice message ? ?  ?

## 2021-07-24 LAB — BODY FLUID CULTURE W GRAM STAIN: Culture: NO GROWTH

## 2021-07-26 ENCOUNTER — Ambulatory Visit (INDEPENDENT_AMBULATORY_CARE_PROVIDER_SITE_OTHER): Payer: Medicaid Other

## 2021-07-26 ENCOUNTER — Encounter: Payer: Self-pay | Admitting: Orthopedic Surgery

## 2021-07-26 ENCOUNTER — Other Ambulatory Visit: Payer: Self-pay

## 2021-07-26 ENCOUNTER — Ambulatory Visit (INDEPENDENT_AMBULATORY_CARE_PROVIDER_SITE_OTHER): Payer: Medicaid Other | Admitting: Orthopedic Surgery

## 2021-07-26 ENCOUNTER — Ambulatory Visit: Payer: Medicaid Other | Admitting: Internal Medicine

## 2021-07-26 VITALS — BP 118/70 | HR 57 | Ht 66.0 in | Wt 165.0 lb

## 2021-07-26 DIAGNOSIS — Z95828 Presence of other vascular implants and grafts: Secondary | ICD-10-CM

## 2021-07-26 NOTE — Patient Instructions (Signed)
Finish the medication and you should be good  ?

## 2021-07-26 NOTE — Progress Notes (Signed)
FOLLOW UP  ? ?Encounter Diagnosis  ?Name Primary?  ? S/P PICC central line placement Yes  ? ? ? ?Chief Complaint  ?Patient presents with  ? Knee Pain  ?  Right,  disseminated gonorrhea infection   ? Ankle Pain  ?  LEFT,  disseminated gonorrhea infection   ? ? ? ?Mr. Brisk has had his IV ceftriaxone he has had his oral doxycycline is taken it is indicated.  He had 5 days of IV ceftriaxone and 6 days outpatient doxycycline ? ?He has no pain or loss of function in his right knee or left ankle ? ?After much investigation we pulled his PICC line out we took an x-ray to confirm that it was completely removed and placed a picture of the PICC line that was removed in the record ? ?The patient should return on an as-needed basis ?

## 2021-07-28 ENCOUNTER — Ambulatory Visit: Payer: Medicaid Other | Admitting: Internal Medicine

## 2021-07-30 ENCOUNTER — Telehealth: Payer: Self-pay

## 2021-07-30 NOTE — Telephone Encounter (Signed)
Called patient to reschedule missed appointment with Dr. Renold Don on 07/28/21. Spoke with patient, who stated he was feeling much better but was willing to reschedule appointment. Rescheduled with Dr. Renold Don for 08/05/21. ? ?Wyvonne Lenz, RN  ?

## 2021-07-31 DIAGNOSIS — Z419 Encounter for procedure for purposes other than remedying health state, unspecified: Secondary | ICD-10-CM | POA: Diagnosis not present

## 2021-08-04 ENCOUNTER — Ambulatory Visit (HOSPITAL_COMMUNITY): Payer: Medicaid Other | Attending: Internal Medicine | Admitting: Physical Therapy

## 2021-08-04 DIAGNOSIS — M25561 Pain in right knee: Secondary | ICD-10-CM | POA: Insufficient documentation

## 2021-08-04 DIAGNOSIS — R2689 Other abnormalities of gait and mobility: Secondary | ICD-10-CM | POA: Insufficient documentation

## 2021-08-04 DIAGNOSIS — R29898 Other symptoms and signs involving the musculoskeletal system: Secondary | ICD-10-CM | POA: Insufficient documentation

## 2021-08-04 DIAGNOSIS — M6281 Muscle weakness (generalized): Secondary | ICD-10-CM | POA: Insufficient documentation

## 2021-08-05 ENCOUNTER — Ambulatory Visit: Payer: Medicaid Other | Admitting: Internal Medicine

## 2021-08-24 ENCOUNTER — Ambulatory Visit (HOSPITAL_COMMUNITY): Payer: Medicaid Other | Admitting: Physical Therapy

## 2021-08-24 ENCOUNTER — Emergency Department (HOSPITAL_COMMUNITY)
Admission: EM | Admit: 2021-08-24 | Discharge: 2021-08-24 | Disposition: A | Discharge status: Home / Self Care | Payer: Medicaid Other | Attending: Emergency Medicine | Admitting: Emergency Medicine

## 2021-08-24 ENCOUNTER — Encounter (HOSPITAL_COMMUNITY): Payer: Self-pay | Admitting: Physical Therapy

## 2021-08-24 ENCOUNTER — Other Ambulatory Visit: Payer: Self-pay

## 2021-08-24 ENCOUNTER — Encounter (HOSPITAL_COMMUNITY): Payer: Self-pay | Admitting: Emergency Medicine

## 2021-08-24 DIAGNOSIS — R369 Urethral discharge, unspecified: Secondary | ICD-10-CM | POA: Diagnosis not present

## 2021-08-24 DIAGNOSIS — Z202 Contact with and (suspected) exposure to infections with a predominantly sexual mode of transmission: Secondary | ICD-10-CM

## 2021-08-24 DIAGNOSIS — R29898 Other symptoms and signs involving the musculoskeletal system: Secondary | ICD-10-CM

## 2021-08-24 DIAGNOSIS — M6281 Muscle weakness (generalized): Secondary | ICD-10-CM

## 2021-08-24 DIAGNOSIS — R2689 Other abnormalities of gait and mobility: Secondary | ICD-10-CM

## 2021-08-24 DIAGNOSIS — M25561 Pain in right knee: Secondary | ICD-10-CM

## 2021-08-24 MED ORDER — DOXYCYCLINE HYCLATE 100 MG PO CAPS: 100.0000 mg | ORAL_CAPSULE | Freq: Two times a day (BID) | ORAL | 0 refills | Status: DC

## 2021-08-24 MED ORDER — CEFTRIAXONE SODIUM 500 MG IJ SOLR
500.0000 mg | Freq: Once | INTRAMUSCULAR | Status: AC
  Administered 2021-08-24: 500 mg via INTRAMUSCULAR
  Filled 2021-08-24: qty 500

## 2021-08-24 MED ORDER — LIDOCAINE HCL (PF) 1 % IJ SOLN
INTRAMUSCULAR | Status: AC
  Administered 2021-08-24: 1 mL
  Filled 2021-08-24: qty 2

## 2021-08-24 NOTE — ED Provider Notes (Signed)
?[facility] DEPARTMENT ?Provider Note ? ? ?CSN: [id number] ?Arrival date & time: 08/24/21  1010 ? ?  ? ?History ? ?Chief Complaint  ?Patient presents with  ? SEXUALLY TRANSMITTED DISEASE  ? ? ?Stephen Byrd is a 21 y.o. male. ? ?Patient with history of septic joint and gonorrhea presents today with complaints of penile discharge.  He states that same has been going on for the past week and is similar in nature to when he had gonorrhea in the past.  He denies any other symptoms including fevers, chills, dysuria, hematuria, joint pain, or joint swelling.  No known recent STD exposures, however does endorse recent sexual activity.  ? ?The history is provided by the patient. No language interpreter was used.  ? ?  ? ?Home Medications ?Prior to Admission medications   ?Medication Sig Start Date End Date Taking? Authorizing Provider  ?acetaminophen (TYLENOL) 325 MG tablet Take 2 tablets (650 mg total) by mouth every 6 (six) hours as needed for mild pain (or Fever >/= 101). 07/19/21  Yes Rodolph Bong, MD  ?meloxicam (MOBIC) 15 MG tablet Take 1 tablet (15 mg total) by mouth daily. ?Patient not taking: Reported on 08/24/2021 02/17/21   Cammy Copa, MD  ?naproxen (NAPROSYN) 500 MG tablet Take 1 tablet (500 mg total) by mouth 2 (two) times daily. ?Patient not taking: Reported on 08/24/2021 03/16/19   Petrucelli, Lelon Mast R, PA-C  ?traMADol (ULTRAM) 50 MG tablet Take 1 tablet (50 mg total) by mouth every 6 (six) hours as needed. ?Patient not taking: Reported on 08/24/2021 07/19/21 07/19/22  Rodolph Bong, MD  ?   ? ?Allergies    ?Patient has no known allergies.   ? ?Review of Systems   ?Review of Systems  ?Constitutional:  Negative for chills and fever.  ?Genitourinary:  Positive for penile discharge. Negative for decreased urine volume, difficulty urinating, dysuria, enuresis, flank pain, frequency, genital sores, hematuria, penile pain, penile swelling, scrotal swelling, testicular pain and urgency.   ?Musculoskeletal:  Negative for arthralgias, joint swelling and myalgias.  ?All other systems reviewed and are negative. ? ?Physical Exam ?Updated Vital Signs ?BP 117/70   Pulse (!) 55   Temp 98.2 ?F (36.8 ?C)   Resp 18   Ht 5\' 7"  (1.702 m)   Wt 77.1 kg   SpO2 100%   BMI 26.63 kg/m?  ?Physical Exam ?Vitals and nursing note reviewed.  ?Constitutional:   ?   General: He is not in acute distress. ?   Appearance: Normal appearance. He is normal weight. He is not ill-appearing, toxic-appearing or diaphoretic.  ?HENT:  ?   Head: Normocephalic and atraumatic.  ?Cardiovascular:  ?   Rate and Rhythm: Normal rate.  ?Pulmonary:  ?   Effort: Pulmonary effort is normal. No respiratory distress.  ?Musculoskeletal:     ?   General: Normal range of motion.  ?   Cervical back: Normal range of motion.  ?Skin: ?   General: Skin is warm and dry.  ?Neurological:  ?   General: No focal deficit present.  ?   Mental Status: He is alert.  ?Psychiatric:     ?   Mood and Affect: Mood normal.     ?   Behavior: Behavior normal.  ? ? ?ED Results / Procedures / Treatments   ?Labs ?(all labs ordered are listed, but only abnormal results are displayed) ?Labs Reviewed  ?GC/CHLAMYDIA PROBE AMP ([place]) NOT AT [facility]  ? ? ?EKG ?None ? ?Radiology ?  No results found. ? ?Procedures ?Procedures  ? ? ?Medications Ordered in ED ?Medications  ?cefTRIAXone (ROCEPHIN) injection 500 mg (500 mg Intramuscular Given 08/24/21 1353)  ?lidocaine (PF) (XYLOCAINE) 1 % injection (1 mL  Given 08/24/21 1355)  ? ? ?ED Course/ Medical Decision Making/ A&P ?  ?                        ?Medical Decision Making ?Risk ?Prescription drug management. ? ? ?Pt presents with concerns for possible STD.  Pt understands that they have GC/Chlamydia cultures pending and that they will need to inform all sexual partners if results return positive. Pt has been treated prophylactically with doxycycline and Rocephin due to pts history and complaint.  Patient to be discharged with  instructions to follow up with PCP. Discussed importance of using protection when sexually active.  He is afebrile, nontoxic-appearing, and in no acute distress with reassuring vital signs and benign physical exam.  Educated on red flag symptoms of prompt immediate return.  Discharged stable condition. ? ? ?Final Clinical Impression(s) / ED Diagnoses ?Final diagnoses:  ?Possible exposure to STD  ? ? ?Rx / DC Orders ?ED Discharge Orders   ? ?      Ordered  ?  doxycycline (VIBRAMYCIN) 100 MG capsule  2 times daily       ? 08/24/21 1438  ? ?  ?  ? ?  ?An After Visit Summary was printed and given to the patient. ? ? ?  ?Silva Bandy, PA-C ?08/24/21 1440 ? ?  ?Bethann Berkshire, MD ?08/24/21 1734 ? ?

## 2021-08-24 NOTE — Therapy (Signed)
?OUTPATIENT PHYSICAL THERAPY LOWER EXTREMITY EVALUATION ? ? ?Patient Name: Stephen Byrd ?MRN: [id number] ?DOB:May 07, 2000, 21 y.o., male ?Today's Date: 08/24/2021 ? ? PT End of Session - 08/24/21 0926   ? ? Visit Number 1   ? Number of Visits 12   ? Date for PT Re-Evaluation 10/05/21   ? Authorization Type [employer]   ? Authorization Time Period 27 combined visit limit per year   ? Authorization - Visit Number 1   ? Authorization - Number of Visits 27   ? PT Start Time [phone]   arrives late  ? PT Stop Time [phone]   ? PT Time Calculation (min) 27 min   ? Activity Tolerance Patient tolerated treatment well;Patient limited by pain   ? Behavior During Therapy [facility] for tasks assessed/performed   ? ?  ?  ? ?  ? ? ?Past Medical History:  ?Diagnosis Date  ? ADHD (attention deficit hyperactivity disorder)   ? Bipolar 1 disorder ([place])   ? Heart murmur   ? ?Past Surgical History:  ?Procedure Laterality Date  ? ACROMIO-CLAVICULAR JOINT REPAIR Left 02/13/2018  ? Procedure: LEFT SHOULDER ACROMIOCLAVICULAR RECONSTRUCTION;  Surgeon: Meredith Pel, MD;  Location: [facility];  Service: Orthopedics;  Laterality: Left;  ? NO PAST SURGERIES    ? ?Patient Active Problem List  ? Diagnosis Date Noted  ? Substance use disorder 07/18/2021  ? Gonorrhea 07/18/2021  ? Septic joint ([place]) 07/17/2021  ? ? ?PCP: Pcp, No ? ?REFERRING PROVIDER: Irine Seal, MD ? ?REFERRING DIAG: M19.90 (ICD-10-CM) - Joint inflammation  ? ?THERAPY DIAG:  ?Right knee pain, unspecified chronicity ? ?Muscle weakness (generalized) ? ?Other abnormalities of gait and mobility ? ?Other symptoms and signs involving the musculoskeletal system ? ?ONSET DATE: 3/19 ? ?SUBJECTIVE:  ? ?SUBJECTIVE STATEMENT: ?States knee has been bother. Having strength deficit with knee. Has been trying to walk more. Stiff with getting up after sitting. Has a limp and has been using brace. Admitted in hospital from 3/19-3/20 for IV antibiotics. ? ?PERTINENT HISTORY: Knee pain ,Right,   disseminated gonorrhea infection  ?Ankle Pain LEFT,  disseminated gonorrhea infection  ? ?PAIN:  ?Are you having pain? No ? ?PRECAUTIONS: None ? ?WEIGHT BEARING RESTRICTIONS No ? ?FALLS:  ?Has patient fallen in last 6 months? Yes. Number of falls 2 ? ?LIVING ENVIRONMENT: ?Lives with: lives with their spouse ?Lives in: House/apartment ?Stairs: Yes: External: 4 steps; on right going up, on left going up, and can reach both ?Has following equipment at home: Single point cane ? ?OCCUPATION: Unemployed - door dash ? ?PLOF: Independent ? ?PATIENT GOALS Get back to being his self, play basketball with kids ? ? ?OBJECTIVE:  ?COGNITION: ? Overall cognitive status: Within functional limits for tasks assessed   ?  ?SENSATION: ?WFL ? ? ?POSTURE:  ?Slouched in seated ? ?PALPATION: ?TTP with edema throughout R knee, hypermobile patella medial/lateral R knee ? ?LE ROM: ? ?Active ROM Right ?08/24/2021 Left ?08/24/2021  ?Hip flexion    ?Hip extension    ?Hip abduction    ?Hip adduction    ?Hip internal rotation    ?Hip external rotation    ?Knee flexion 117 142  ?Knee extension 0 0  ?Ankle dorsiflexion    ?Ankle plantarflexion    ?Ankle inversion    ?Ankle eversion    ? (Blank rows = not tested) ? ?LE MMT: ? ?MMT Right ?08/24/2021 Left ?08/24/2021  ?Hip flexion 4+/5 5/5  ?Hip extension    ?Hip abduction    ?  Hip adduction    ?Hip internal rotation    ?Hip external rotation    ?Knee flexion 4+/5 5/5  ?Knee extension 4/5 lacking TKE ROM 5/5  ?Ankle dorsiflexion 5/5 5/5  ?Ankle plantarflexion    ?Ankle inversion    ?Ankle eversion    ? (Blank rows = not tested) ? ? ? ?FUNCTIONAL TESTS:  ?5 times sit to stand: 18.79 seconds without UE support ?Stairs: decreased strength and motor RLE with ascending/descending with intermittent unsteadiness ?Squat: able to squat to approx 45 degrees, limited  ? ?GAIT: ?Distance walked: 150 ?Assistive device utilized: None ?Level of assistance: Complete Independence ?Comments: antalgic ? ? ? ?TODAY'S  TREATMENT: ?08/24/21 ?Quad set 10x 5 second holds R ?Heel slides 10 x 5 second holds ?LAQ 10x 5 second holds ? ? ?PATIENT EDUCATION:  ?Education details: Patient educated on exam findings, POC, scope of PT, HEP, and gradual increases in activity as tolerated. ?Person educated: Patient ?Education method: Explanation, Demonstration, and Handouts ?Education comprehension: verbalized understanding, returned demonstration, verbal cues required, and tactile cues required ? ? ? ?HOME EXERCISE PROGRAM: ?4/25/23Access Code: [email or web address] ?Exercises ?- Supine Quadricep Sets  - 5 x daily - 7 x weekly - 10 reps - 5-10 second  hold ?- Supine Heel Slide  - 5 x daily - 7 x weekly - 2 sets - 10 reps - 5 second hold ?- Seated Long Arc Quad  - 5 x daily - 7 x weekly - 2 sets - 10 reps - 5 second hold ? ?ASSESSMENT: ? ?CLINICAL IMPRESSION: ?Patient a 21 y.o. y.o. male who was seen today for physical therapy evaluation and treatment for R knee pain. Patient will continue to benefit from physical therapy in order to improve function and reduce impairment. ? ? ?OBJECTIVE IMPAIRMENTS Abnormal gait, decreased activity tolerance, decreased balance, decreased mobility, difficulty walking, decreased ROM, decreased strength, increased edema, impaired flexibility, and improper body mechanics.  ? ?ACTIVITY LIMITATIONS cleaning, community activity, meal prep, occupation, yard work, and shopping.  ? ?PERSONAL FACTORS Time since onset of injury/illness/exacerbation and 1 comorbidity: joint inflammation  are also affecting patient's functional outcome.  ? ? ?REHAB POTENTIAL: Good ? ?CLINICAL DECISION MAKING: Stable/uncomplicated ? ?EVALUATION COMPLEXITY: Low ? ? ?GOALS: ?Goals reviewed with patient? No ? ?SHORT TERM GOALS: Target date: 09/14/2021 ? ?Patient will be independent with HEP in order to improve functional outcomes. ?Baseline:  ?Goal status: INITIAL ? ?2.  Patient will report at least 25% improvement in symptoms for improved quality of  life. ?Baseline:  ?Goal status: INITIAL ? ? ? ?LONG TERM GOALS: Target date: 10/05/2021 ? ?Patient will report at least 75% improvement in symptoms for improved quality of life. ?Baseline:  ?Goal status: INITIAL ? ?2.  Patient will demonstrate good squat mechanics below 90 degrees with proper mechanics for improved ability to interact with children. ?Baseline:  ?Goal status: INITIAL ? ?3.  Patient will be able to navigate stairs with reciprocal pattern without compensation in order to demonstrate improved LE strength. ?Baseline:  ?Goal status: INITIAL ? ?4.  Patient will be able to perform forward step down test without deviation in order to demonstrate improved LE strength and motor control.  ?Baseline: unable ?Goal status: INITIAL ? ?5.  Patient will improve ROM for R knee extension/flexion to 0-135 degrees to improve squatting, and other functional mobility. ?Baseline: 0-118 ?Goal status: INITIAL ? ? ? ?PLAN: ?PT FREQUENCY: 2x/week ? ?PT DURATION: 6 weeks ? ?PLANNED INTERVENTIONS: Therapeutic exercises, Therapeutic activity, Neuromuscular re-education, Balance training, Gait training, Patient/Family education,  Joint manipulation, Joint mobilization, Stair training, Orthotic/Fit training, DME instructions, Aquatic Therapy, Dry Needling, Electrical stimulation, Spinal manipulation, Spinal mobilization, Cryotherapy, Moist heat, Compression bandaging, scar mobilization, Splintting, Taping, Traction, Ultrasound, Ionotophoresis 4mg /ml Dexamethasone, and Manual therapy ? ? ?PLAN FOR NEXT SESSION: R knee strength and mobility, progress as able ? ? ?Stephen Byrd, PT ?08/24/2021, 10:01 AM  ?

## 2021-08-24 NOTE — Discharge Instructions (Addendum)
As we discussed, your GC/chlamydia swab is pending at time of discharge.  Given your symptoms, we have proceeded with treatment despite positive result.  Please fill the antibiotic prescribed to you and take as prescribed in its entirety for management of your symptoms.  In the interim, please monitor your MyChart for test results.  If positive, you will need to inform all sexual partners of your positive test results and abstain from sexual intercourse until you have a test of cure with your PCP. ? ?Return if development of any new or worsening symptoms. ?

## 2021-08-24 NOTE — ED Triage Notes (Signed)
Pt c/o greenish color discharge from penis x 1 week. Denies pain/burning.  ?

## 2021-08-25 LAB — GC/CHLAMYDIA PROBE AMP (~~LOC~~) NOT AT ARMC
Chlamydia: NEGATIVE
Comment: NEGATIVE
Comment: NORMAL
Neisseria Gonorrhea: POSITIVE — AB

## 2021-08-30 DIAGNOSIS — Z419 Encounter for procedure for purposes other than remedying health state, unspecified: Secondary | ICD-10-CM | POA: Diagnosis not present

## 2021-08-31 ENCOUNTER — Ambulatory Visit (HOSPITAL_COMMUNITY): Payer: Medicaid Other | Attending: Orthopedic Surgery

## 2021-08-31 DIAGNOSIS — M6281 Muscle weakness (generalized): Secondary | ICD-10-CM | POA: Diagnosis not present

## 2021-08-31 DIAGNOSIS — R29898 Other symptoms and signs involving the musculoskeletal system: Secondary | ICD-10-CM | POA: Diagnosis not present

## 2021-08-31 DIAGNOSIS — M25561 Pain in right knee: Secondary | ICD-10-CM | POA: Diagnosis not present

## 2021-08-31 DIAGNOSIS — R2689 Other abnormalities of gait and mobility: Secondary | ICD-10-CM | POA: Insufficient documentation

## 2021-08-31 NOTE — Therapy (Signed)
?OUTPATIENT PHYSICAL THERAPY TREATMENT NOTE ? ? ?Patient Name: Stephen Byrd ?MRN: [id number] ?DOB:05/16/2000, 21 y.o., male ?Today's Date: 08/31/2021 ? ?PCP: None ?REFERRING PROVIDER: Ramiro Harvestaniel Thompson, MD ? ?END OF SESSION:  ? PT End of Session - 08/31/21 1344   ? ? Visit Number 2   ? Number of Visits 12   ? Date for PT Re-Evaluation 10/05/21   ? Authorization Type [employer]   ? Authorization Time Period 27 combined visit limit per year   ? Authorization - Visit Number 1   ? Authorization - Number of Visits 27   ? PT Start Time 1345   ? PT Stop Time 1430   ? PT Time Calculation (min) 45 min   ? Activity Tolerance Patient tolerated treatment well;Patient limited by pain   ? [facility] assessed/performed   ? ?  ?  ? ?  ? ? ?Past Medical History:  ?Diagnosis Date  ? ADHD (attention deficit hyperactivity disorder)   ? Bipolar 1 disorder ([facility])   ? Heart murmur   ? ?Past Surgical History:  ?Procedure Laterality Date  ? ACROMIO-CLAVICULAR JOINT REPAIR Left 02/13/2018  ? Procedure: LEFT SHOULDER ACROMIOCLAVICULAR RECONSTRUCTION;  Surgeon: Stephen Byrd, Stephen Scott, MD;  [facility];  Service: Orthopedics;  Laterality: Left;  ? NO PAST SURGERIES    ? ?Patient Active Problem List  ? Diagnosis Date Noted  ? Substance use disorder 07/18/2021  ? Gonorrhea 07/18/2021  ? Septic joint ([facility]) 07/17/2021  ? ? ?REFERRING DIAG:  M19.90 (ICD-10-CM) - Joint inflammation  ? ?THERAPY DIAG:  ?Right knee pain, unspecified chronicity ? ?Muscle weakness (generalized) ? ?Other abnormalities of gait and mobility ? ?Other symptoms and signs involving the musculoskeletal system ? ?PERTINENT HISTORY: Knee pain ,Right,  disseminated gonorrhea infection  ?Ankle Pain LEFT,  disseminated gonorrhea infection  ? ?PRECAUTIONS: None ? ?SUBJECTIVE: 4/10 pain today Right knee; Left knee start hurting some too 4/10 as well as left ankle. Goes by Stephen Byrd"Stephen Byrd" ? ?PAIN:  ?Are you having pain? Yes: NPRS scale: 4/10 ?Pain location: knees  and left ankle ?Aggravating factors: sit too long ?Relieving factors: movement ? ? ?OBJECTIVE: (objective measures completed at initial evaluation unless otherwise dated) ? ? ?OBJECTIVE:  ?COGNITION: ?          Overall cognitive status: Within functional limits for tasks assessed               ?           ?SENSATION: ?WFL ?  ?  ?POSTURE:  ?Slouched in seated ?  ?PALPATION: ?TTP with edema throughout R knee, hypermobile patella medial/lateral R knee ?  ?LE ROM: ?  ?Active ROM Right ?08/24/2021 Left ?08/24/2021  ?Hip flexion      ?Hip extension      ?Hip abduction      ?Hip adduction      ?Hip internal rotation      ?Hip external rotation      ?Knee flexion 117 142  ?Knee extension 0 0  ?Ankle dorsiflexion      ?Ankle plantarflexion      ?Ankle inversion      ?Ankle eversion      ? (Blank rows = not tested) ?  ?LE MMT: ?  ?MMT Right ?08/24/2021 Left ?08/24/2021  ?Hip flexion 4+/5 5/5  ?Hip extension      ?Hip abduction      ?Hip adduction      ?Hip internal rotation      ?  Hip external rotation      ?Knee flexion 4+/5 5/5  ?Knee extension 4/5 lacking TKE ROM 5/5  ?Ankle dorsiflexion 5/5 5/5  ?Ankle plantarflexion      ?Ankle inversion      ?Ankle eversion      ? (Blank rows = not tested) ?  ?  ?  ?FUNCTIONAL TESTS:  ?5 times sit to stand: 18.79 seconds without UE support ?Stairs: decreased strength and motor RLE with ascending/descending with intermittent unsteadiness ?Squat: able to squat to approx 45 degrees, limited  ?  ?GAIT: ?Distance walked: 150 ?Assistive device utilized: None ?Level of assistance: Complete Independence ?Comments: antalgic ?  ?  ?  ?TODAY'S TREATMENT: ?08/31/21 ?Bike seat 14 x 5' warm up ?Review of goals and HEP ? ?Supine: ?Quad sets 5" hold x 10 ?Heel slides x 1' ?SAQ X 10 ? ? ?Sit to stand 2 x 5 no UE assist ? ?Standing TKE red thera-band x 10 ? ?Trial of kinesiotape R distal quad for swelling ? ? ?08/24/21 ?Quad set 10x 5 second holds R ?Heel slides 10 x 5 second holds ?LAQ 10x 5 second holds ?  ?   ?PATIENT EDUCATION:  ?Education details: Patient educated on exam findings, POC, scope of PT, HEP, and gradual increases in activity as tolerated. ?Person educated: Patient ?Education method: Explanation, Demonstration, and Handouts ?Education comprehension: verbalized understanding, returned demonstration, verbal cues required, and tactile cues required ?  ?  ?  ?HOME EXERCISE PROGRAM: ?4/25/23Access Code: [email or web address] ?Exercises ?- Supine Quadricep Sets  - 5 x daily - 7 x weekly - 10 reps - 5-10 second  hold ?- Supine Heel Slide  - 5 x daily - 7 x weekly - 2 sets - 10 reps - 5 second hold ?- Seated Long Arc Quad  - 5 x daily - 7 x weekly - 2 sets - 10 reps - 5 second hold ?  ?ASSESSMENT: ?  ?CLINICAL IMPRESSION: ?Today's session included review of HEP and goals set at evaluation.  Patient in verbalizes agreement with set goals.  Added bike for warm up and to improve mobility of knees.  Trial of kinesiotaping for Right distal quad swelling. Patient denies adhesive allergy.  Patient instructed to remove if he has any adverse effects. Added sit to stand and SAQs to HEP. Added TKE's to work on improving right knee extension.   Patient will continue to benefit from physical therapy in order to improve function and reduce impairment. ?  ?  ?OBJECTIVE IMPAIRMENTS Abnormal gait, decreased activity tolerance, decreased balance, decreased mobility, difficulty walking, decreased ROM, decreased strength, increased edema, impaired flexibility, and improper body mechanics.  ?  ?ACTIVITY LIMITATIONS cleaning, community activity, meal prep, occupation, yard work, and shopping.  ?  ?PERSONAL FACTORS Time since onset of injury/illness/exacerbation and 1 comorbidity: joint inflammation  are also affecting patient's functional outcome.  ?  ?  ?REHAB POTENTIAL: Good ?  ?CLINICAL DECISION MAKING: Stable/uncomplicated ?  ?EVALUATION COMPLEXITY: Low ?  ?  ?GOALS: ?Goals reviewed with patient? No ?  ?SHORT TERM GOALS: Target date:  09/14/2021 ?  ?Patient will be independent with HEP in order to improve functional outcomes. ?Baseline:  ?Goal status: ongoing ? ?2.  Patient will report at least 25% improvement in symptoms for improved quality of life. ?Baseline:  ?Goal status: ongoing ?  ?  ?  ?LONG TERM GOALS: Target date: 10/05/2021 ?  ?Patient will report at least 75% improvement in symptoms for improved quality of life. ?Baseline:  ?Goal status: ongoing ?  ?  2.  Patient will demonstrate good squat mechanics below 90 degrees with proper mechanics for improved ability to interact with children. ?Baseline:  ?Goal status: ongoing ?  ?3.  Patient will be able to navigate stairs with reciprocal pattern without compensation in order to demonstrate improved LE strength. ?Baseline:  ?Goal status: ongoing ?  ?4.  Patient will be able to perform forward step down test without deviation in order to demonstrate improved LE strength and motor control.  ?Baseline: unable ?Goal status: ongoing ?  ?5.  Patient will improve ROM for R knee extension/flexion to 0-135 degrees to improve squatting, and other functional mobility. ?Baseline: 0-118 ?Goal status: ongoing ?  ?  ?  ?PLAN: ?PT FREQUENCY: 2x/week ?  ?PT DURATION: 6 weeks ?  ?PLANNED INTERVENTIONS: Therapeutic exercises, Therapeutic activity, Neuromuscular re-education, Balance training, Gait training, Patient/Family education, Joint manipulation, Joint mobilization, Stair training, Orthotic/Fit training, DME instructions, Aquatic Therapy, Dry Needling, Electrical stimulation, Spinal manipulation, Spinal mobilization, Cryotherapy, Moist heat, Compression bandaging, scar mobilization, Splintting, Taping, Traction, Ultrasound, Ionotophoresis 4mg /ml Dexamethasone, and Manual therapy ?  ?  ?PLAN FOR NEXT SESSION: R knee strength and mobility, progress as able.  assess reaction to kinesiotape.  ?  ?  ? ? ?2:36 PM, 08/31/21 ?Cigi Bega Small Mads Borgmeyer MPT ?[facility] ?[place] [phone] ?Ph:[phone] ? ? ?

## 2021-09-02 ENCOUNTER — Ambulatory Visit (HOSPITAL_COMMUNITY): Payer: Medicaid Other

## 2021-09-02 DIAGNOSIS — R2689 Other abnormalities of gait and mobility: Secondary | ICD-10-CM | POA: Diagnosis not present

## 2021-09-02 DIAGNOSIS — M25561 Pain in right knee: Secondary | ICD-10-CM

## 2021-09-02 DIAGNOSIS — R29898 Other symptoms and signs involving the musculoskeletal system: Secondary | ICD-10-CM | POA: Diagnosis not present

## 2021-09-02 DIAGNOSIS — M6281 Muscle weakness (generalized): Secondary | ICD-10-CM

## 2021-09-02 NOTE — Therapy (Signed)
OUTPATIENT PHYSICAL THERAPY TREATMENT NOTE   Patient Name: Stephen Byrd MRN: [id number] DOB:2000/09/10, 21 y.o., male Today's Date: 09/02/2021  PCP: None REFERRING PROVIDER: Ramiro Harvest, MD  END OF SESSION:   PT End of Session - 09/02/21 0809     Visit Number 3    Number of Visits 12    Date for PT Re-Evaluation 10/05/21    Authorization Type [employer]    Authorization Time Period 27 combined visit limit per year    Authorization - Visit Number 2    Authorization - Number of Visits 27    PT Start Time 0815    PT Stop Time 0858    PT Time Calculation (min) 43 min    Activity Tolerance Patient tolerated treatment well;Patient limited by pain    Behavior During Therapy WFL for tasks assessed/performed              Past Medical History:  Diagnosis Date   ADHD (attention deficit hyperactivity disorder)    Bipolar 1 disorder (HCC)    Heart murmur    Past Surgical History:  Procedure Laterality Date   ACROMIO-CLAVICULAR JOINT REPAIR Left 02/13/2018   Procedure: LEFT SHOULDER ACROMIOCLAVICULAR RECONSTRUCTION;  Surgeon: Cammy Copa, MD;  Location: [facility] OR;  Service: Orthopedics;  Laterality: Left;   NO PAST SURGERIES     Patient Active Problem List   Diagnosis Date Noted   Substance use disorder 07/18/2021   Gonorrhea 07/18/2021   Septic joint (HCC) 07/17/2021    REFERRING DIAG:  M19.90 (ICD-10-CM) - Joint inflammation   THERAPY DIAG:  Right knee pain, unspecified chronicity  Muscle weakness (generalized)  Other abnormalities of gait and mobility  Other symptoms and signs involving the musculoskeletal system  PERTINENT HISTORY: Knee pain ,Right,  disseminated gonorrhea infection  Ankle Pain LEFT,  disseminated gonorrhea infection   PRECAUTIONS: None  SUBJECTIVE: Kinesiotape came off with his pants last treatment. Right Knee Feeling a little better; a little less swollen Goes by "[name]"  PAIN:  Are you having pain? Yes: NPRS scale:  2/10 Pain location: knees and left ankle Aggravating factors: sit too long Relieving factors: movement   OBJECTIVE: (objective measures completed at initial evaluation unless otherwise dated)   OBJECTIVE:  COGNITION:           Overall cognitive status: Within functional limits for tasks assessed                          SENSATION: WFL     POSTURE:  Slouched in seated   PALPATION: TTP with edema throughout R knee, hypermobile patella medial/lateral R knee   LE ROM:   Active ROM Right 08/24/2021 Left 08/24/2021  Hip flexion      Hip extension      Hip abduction      Hip adduction      Hip internal rotation      Hip external rotation      Knee flexion 117 142  Knee extension 0 0  Ankle dorsiflexion      Ankle plantarflexion      Ankle inversion      Ankle eversion       (Blank rows = not tested)   LE MMT:   MMT Right 08/24/2021 Left 08/24/2021  Hip flexion 4+/5 5/5  Hip extension      Hip abduction      Hip adduction      Hip internal rotation  Hip external rotation      Knee flexion 4+/5 5/5  Knee extension 4/5 lacking TKE ROM 5/5  Ankle dorsiflexion 5/5 5/5  Ankle plantarflexion      Ankle inversion      Ankle eversion       (Blank rows = not tested)       FUNCTIONAL TESTS:  5 times sit to stand: 18.79 seconds without UE support Stairs: decreased strength and motor RLE with ascending/descending with intermittent unsteadiness Squat: able to squat to approx 45 degrees, limited    GAIT: Distance walked: 150 Assistive device utilized: None Level of assistance: Complete Independence Comments: antalgic       TODAY'S TREATMENT: 09/02/21 Bike 14 x 6' warm up  Standing:    Heel raises 2 x 10   Slant board 5 x 30"  Hip ABD x 10 each leg  TKE green thera band x 20    Supine:  Quad sets x 20  Heel slides x 1'  Bridge with ball  hip Adduction x 10  Right SLR with assist 2 x 5   Prone:  Hip extension 2 x 10 each   Sidelying hip ABD x 10  each          08/31/21 Bike seat 14 x 5' warm up Review of goals and HEP  Supine: Quad sets 5" hold x 10 Heel slides x 1' SAQ X 10   Sit to stand 2 x 5 no UE assist  Standing TKE red thera-band x 10  Trial of kinesiotape R distal quad for swelling   08/24/21 Quad set 10x 5 second holds R Heel slides 10 x 5 second holds LAQ 10x 5 second holds     PATIENT EDUCATION:  Education details: Patient educated on exam findings, POC, scope of PT, HEP, and gradual increases in activity as tolerated. Person educated: Patient Education method: Explanation, Demonstration, and Handouts Education comprehension: verbalized understanding, returned demonstration, verbal cues required, and tactile cues required       HOME EXERCISE PROGRAM: 09/02/21 Access Code: [email or web address] URL: [email or web address] Date: 09/02/2021 Prepared by: [facility] - Rehab  Exercises - Standing Heel Raise with Support  - 1 x daily - 7 x weekly - 2 sets - 10 reps - Supine Bridge with Mini Swiss Ball Between Knees  - 1 x daily - 7 x weekly - 1 sets - 10 reps - Supine Straight Leg Raises  - 1 x daily - 7 x weekly - 2 sets - 5 reps - Prone Hip Extension  - 1 x daily - 7 x weekly - 2 sets - 10 reps - Sidelying Hip Abduction  - 1 x daily - 7 x weekly - 2 sets - 10 reps   4/25/23Access Code: [email or web address] Exercises - Supine Quadricep Sets  - 5 x daily - 7 x weekly - 10 reps - 5-10 second  hold - Supine Heel Slide  - 5 x daily - 7 x weekly - 2 sets - 10 reps - 5 second hold - Seated Long Arc Quad  - 5 x daily - 7 x weekly - 2 sets - 10 reps - 5 second hold   ASSESSMENT:   CLINICAL IMPRESSION: Patient is wearing a knee brace Right knee today. Today's session focused on continued Right knee mobility and strengthening. Added hip Abduction, SLR, prone hip extension and heel raise for strengthening.  Patient needs assistance for SLR as quad starts to shake with fatigue. Continued swelling noted distal quad that likely  inhibits quad function. Continued limp noted and Right knee varus more than Left.  Patient will continue to benefit from physical therapy in order to improve function and reduce impairment.     OBJECTIVE IMPAIRMENTS Abnormal gait, decreased activity tolerance, decreased balance, decreased mobility, difficulty walking, decreased ROM, decreased strength, increased edema, impaired flexibility, and improper body mechanics.    ACTIVITY LIMITATIONS cleaning, community activity, meal prep, occupation, yard work, and shopping.    PERSONAL FACTORS Time since onset of injury/illness/exacerbation and 1 comorbidity: joint inflammation  are also affecting patient's functional outcome.      REHAB POTENTIAL: Good   CLINICAL DECISION MAKING: Stable/uncomplicated   EVALUATION COMPLEXITY: Low     GOALS: Goals reviewed with patient? No   SHORT TERM GOALS: Target date: 09/14/2021   Patient will be independent with HEP in order to improve functional outcomes. Baseline:  Goal status: ongoing  2.  Patient will report at least 25% improvement in symptoms for improved quality of life. Baseline:  Goal status: ongoing       LONG TERM GOALS: Target date: 10/05/2021   Patient will report at least 75% improvement in symptoms for improved quality of life. Baseline:  Goal status: ongoing   2.  Patient will demonstrate good squat mechanics below 90 degrees with proper mechanics for improved ability to interact with children. Baseline:  Goal status: ongoing   3.  Patient will be able to navigate stairs with reciprocal pattern without compensation in order to demonstrate improved LE strength. Baseline:  Goal status: ongoing   4.  Patient will be able to perform forward step down test without deviation in order to demonstrate improved LE strength and motor control.  Baseline: unable Goal status: ongoing   5.  Patient will improve ROM for R knee extension/flexion to 0-135 degrees to improve squatting, and  other functional mobility. Baseline: 0-118 Goal status: ongoing       PLAN: PT FREQUENCY: 2x/week   PT DURATION: 6 weeks   PLANNED INTERVENTIONS: Therapeutic exercises, Therapeutic activity, Neuromuscular re-education, Balance training, Gait training, Patient/Family education, Joint manipulation, Joint mobilization, Stair training, Orthotic/Fit training, DME instructions, Aquatic Therapy, Dry Needling, Electrical stimulation, Spinal manipulation, Spinal mobilization, Cryotherapy, Moist heat, Compression bandaging, scar mobilization, Splintting, Taping, Traction, Ultrasound, Ionotophoresis 4mg /ml Dexamethasone, and Manual therapy     PLAN FOR NEXT SESSION: continue with lower extremity knee strength and mobility, progress as able.      8:58 AM, 09/02/21 Shelvie Salsberry Small Demarie Uhlig MPT [facility] [place] [phone]

## 2021-09-07 ENCOUNTER — Ambulatory Visit (HOSPITAL_COMMUNITY): Payer: Medicaid Other | Admitting: Physical Therapy

## 2021-09-07 DIAGNOSIS — M6281 Muscle weakness (generalized): Secondary | ICD-10-CM | POA: Diagnosis not present

## 2021-09-07 DIAGNOSIS — M25561 Pain in right knee: Secondary | ICD-10-CM | POA: Diagnosis not present

## 2021-09-07 DIAGNOSIS — R2689 Other abnormalities of gait and mobility: Secondary | ICD-10-CM | POA: Diagnosis not present

## 2021-09-07 DIAGNOSIS — R29898 Other symptoms and signs involving the musculoskeletal system: Secondary | ICD-10-CM

## 2021-09-07 NOTE — Therapy (Signed)
?OUTPATIENT PHYSICAL THERAPY TREATMENT NOTE ? ? ?Patient Name: Stephen Byrd ?MRN: [id number] ?DOB:2001-04-07, 21 y.o., male ?Today's Date: 09/07/2021 ? ?PCP: None ?REFERRING PROVIDER: Ramiro Harvest, MD ? ?END OF SESSION:  ? PT End of Session - 09/07/21 1455   ? ? Visit Number 4   ? Number of Visits 12   ? Date for PT Re-Evaluation 10/05/21   ? Authorization Type [employer]   ? Authorization Time Period 27 combined visit limit per year   ? Authorization - Visit Number 3   ? Authorization - Number of Visits 27   ? PT Start Time 1450   ? PT Stop Time 1530   ? PT Time Calculation (min) 40 min   ? Activity Tolerance Patient tolerated treatment well;Patient limited by pain   ? Behavior During Therapy [facility] for tasks assessed/performed   ? ?  ?  ? ?  ? ? ? ?Past Medical History:  ?Diagnosis Date  ? ADHD (attention deficit hyperactivity disorder)   ? Bipolar 1 disorder (HCC)   ? Heart murmur   ? ?Past Surgical History:  ?Procedure Laterality Date  ? ACROMIO-CLAVICULAR JOINT REPAIR Left 02/13/2018  ? Procedure: LEFT SHOULDER ACROMIOCLAVICULAR RECONSTRUCTION;  Surgeon: Cammy Copa, MD;  Location: [facility] OR;  Service: Orthopedics;  Laterality: Left;  ? NO PAST SURGERIES    ? ?Patient Active Problem List  ? Diagnosis Date Noted  ? Substance use disorder 07/18/2021  ? Gonorrhea 07/18/2021  ? Septic joint (HCC) 07/17/2021  ? ? ?REFERRING DIAG:  M19.90 (ICD-10-CM) - Joint inflammation  ? ?THERAPY DIAG:  ?Right knee pain, unspecified chronicity ? ?Muscle weakness (generalized) ? ?Other abnormalities of gait and mobility ? ?Other symptoms and signs involving the musculoskeletal system ? ?PERTINENT HISTORY: Knee pain ,Right,  disseminated gonorrhea infection  ?Ankle Pain LEFT,  disseminated gonorrhea infection  ? ?PRECAUTIONS: None ? ?SUBJECTIVE: Pt reports no pain in knee but pain in Lt ankle. ?Goes by Seward Carol" ? ?PAIN:  ?Are you having pain? Yes: NPRS scale: 3/10 ?Pain location: knees and left ankle ?Aggravating factors:  sit too long ?Relieving factors: movement ? ? ?OBJECTIVE: (objective measures completed at initial evaluation unless otherwise dated) ? ? ?OBJECTIVE:  ?COGNITION: ?          Overall cognitive status: Within functional limits for tasks assessed               ?           ?SENSATION: ?WFL ?  ?  ?POSTURE:  ?Slouched in seated ?  ?PALPATION: ?TTP with edema throughout R knee, hypermobile patella medial/lateral R knee ?  ?LE ROM: ?  ?Active ROM Right ?08/24/2021 Left ?08/24/2021  ?Hip flexion      ?Hip extension      ?Hip abduction      ?Hip adduction      ?Hip internal rotation      ?Hip external rotation      ?Knee flexion 117 142  ?Knee extension 0 0  ?Ankle dorsiflexion      ?Ankle plantarflexion      ?Ankle inversion      ?Ankle eversion      ? (Blank rows = not tested) ?  ?LE MMT: ?  ?MMT Right ?08/24/2021 Left ?08/24/2021  ?Hip flexion 4+/5 5/5  ?Hip extension      ?Hip abduction      ?Hip adduction      ?Hip internal rotation      ?Hip external rotation      ?  Knee flexion 4+/5 5/5  ?Knee extension 4/5 lacking TKE ROM 5/5  ?Ankle dorsiflexion 5/5 5/5  ?Ankle plantarflexion      ?Ankle inversion      ?Ankle eversion      ? (Blank rows = not tested) ?  ?  ?  ?FUNCTIONAL TESTS:  ?5 times sit to stand: 18.79 seconds without UE support ?Stairs: decreased strength and motor RLE with ascending/descending with intermittent unsteadiness ?Squat: able to squat to approx 45 degrees, limited  ?  ?GAIT: ?Distance walked: 150 ?Assistive device utilized: None ?Level of assistance: Complete Independence ?Comments: antalgic ?  ?  ?  ?TODAY'S TREATMENT: ?09/07/21 ?Bike seat 14 5 minute warm up ?Standing Heelraises 20X ?Hip abduction 20X each ? Hip Extension 20X each ? Vector stance 5X5" each with 1 HHA ? SLS each LE max Lt: 8", Rt: 26" without UE assist ? Slant board 3X30" bilaterally  ? TKE 4PL 2X10 each LE ?Supine:  SLR Rt 3X10 ? SAQ Rt 2X10 5sec hold ?  ? ? ?09/02/21 ?Bike 14 x 6' warm up ? ?Standing: ?   Heel raises 2 x 10 ?  Slant board  5 x 30" ? Hip ABD x 10 each leg ? TKE green thera band x 20 ? ? ? Supine: ? Quad sets x 20 ? Heel slides x 1' ? Bridge with ball  hip Adduction x 10 ? Right SLR with assist 2 x 5 ? ? Prone: ? Hip extension 2 x 10 each ? ? Sidelying hip ABD x 10 each ?  ?  ? ?  ? ? ?08/31/21 ?Bike seat 14 x 5' warm up ?Review of goals and HEP ? ?Supine: ?Quad sets 5" hold x 10 ?Heel slides x 1' ?SAQ X 10 ? ? ?Sit to stand 2 x 5 no UE assist ? ?Standing TKE red thera-band x 10 ? ?Trial of kinesiotape R distal quad for swelling ? ? ?  ?PATIENT EDUCATION:  ?Education details: Patient educated on exam findings, POC, scope of PT, HEP, and gradual increases in activity as tolerated. ?Person educated: Patient ?Education method: Explanation, Demonstration, and Handouts ?Education comprehension: verbalized understanding, returned demonstration, verbal cues required, and tactile cues required ?  ?  ?  ?HOME EXERCISE PROGRAM: ?09/02/21 ?Access Code: [facility] ?URL: [email or web address] ?Date: 09/02/2021 ?Prepared by: [facility] - Rehab ? ?Exercises ?- Standing Heel Raise with Support  - 1 x daily - 7 x weekly - 2 sets - 10 reps ?- Supine Bridge with Mini Swiss Ball Between Knees  - 1 x daily - 7 x weekly - 1 sets - 10 reps ?- Supine Straight Leg Raises  - 1 x daily - 7 x weekly - 2 sets - 5 reps ?- Prone Hip Extension  - 1 x daily - 7 x weekly - 2 sets - 10 reps ?- Sidelying Hip Abduction  - 1 x daily - 7 x weekly - 2 sets - 10 reps ? ? ?4/25/23Access Code: [email or web address] ?Exercises ?- Supine Quadricep Sets  - 5 x daily - 7 x weekly - 10 reps - 5-10 second  hold ?- Supine Heel Slide  - 5 x daily - 7 x weekly - 2 sets - 10 reps - 5 second hold ?- Seated Long Arc Quad  - 5 x daily - 7 x weekly - 2 sets - 10 reps - 5 second hold ?  ?ASSESSMENT: ?  ?CLINICAL IMPRESSION: ?Patient continues to wear a knee brace on Right LE.  Continued with focus on  continued Right knee and Lt ankle strengthening. Began SLS with decreased ability on Lt compared to Rt LE.   Added vectors to help improve LE stability.   Patient no longer needs AA for SLR, completing without extension lag and no mm fatigue visible. Less swelling noted distal quad today.Minimal  limp noted and Right knee varus more than Left.  Patient will continue to benefit from physical therapy in order to improve function and reduce impairment. ?  ?  ?OBJECTIVE IMPAIRMENTS Abnormal gait, decreased activity tolerance, decreased balance, decreased mobility, difficulty walking, decreased ROM, decreased strength, increased edema, impaired flexibility, and improper body mechanics.  ?  ?ACTIVITY LIMITATIONS cleaning, community activity, meal prep, occupation, yard work, and shopping.  ?  ?PERSONAL FACTORS Time since onset of injury/illness/exacerbation and 1 comorbidity: joint inflammation  are also affecting patient's functional outcome.  ?  ?  ?REHAB POTENTIAL: Good ?   ?  ?GOALS: ?Goals reviewed with patient? No ?  ?SHORT TERM GOALS: Target date: 09/14/2021 ?  ?Patient will be independent with HEP in order to improve functional outcomes. ?Baseline:  ?Goal status: ongoing ? ?2.  Patient will report at least 25% improvement in symptoms for improved quality of life. ?Baseline:  ?Goal status: ongoing ?  ?  ?  ?LONG TERM GOALS: Target date: 10/05/2021 ?  ?Patient will report at least 75% improvement in symptoms for improved quality of life. ?Baseline:  ?Goal status: ongoing ?  ?2.  Patient will demonstrate good squat mechanics below 90 degrees with proper mechanics for improved ability to interact with children. ?Baseline:  ?Goal status: ongoing ?  ?3.  Patient will be able to navigate stairs with reciprocal pattern without compensation in order to demonstrate improved LE strength. ?Baseline:  ?Goal status: ongoing ?  ?4.  Patient will be able to perform forward step down test without deviation in order to demonstrate improved LE strength and motor control.  ?Baseline: unable ?Goal status: ongoing ?  ?5.  Patient will improve  ROM for R knee extension/flexion to 0-135 degrees to improve squatting, and other functional mobility. ?Baseline: 0-118 ?Goal status: ongoing ?  ?  ?  ?PLAN: ?PT FREQUENCY: 2x/week ?  ?PT DURATION: 6 we

## 2021-09-09 ENCOUNTER — Ambulatory Visit (HOSPITAL_COMMUNITY): Payer: Medicaid Other | Admitting: Physical Therapy

## 2021-09-09 ENCOUNTER — Telehealth (HOSPITAL_COMMUNITY): Payer: Self-pay | Admitting: Physical Therapy

## 2021-09-09 NOTE — Telephone Encounter (Signed)
Pt did not show for appt.  Called and spoke to pt who states he was busy moving and meant to come by and change his appt.  Schedule full remainder of week and did not have another appt to offer.  Informed of next appt on Tuesday. ? ?Teena Irani, PTA/CLT, WTA ?[phone] ? ?

## 2021-09-13 NOTE — Telephone Encounter (Signed)
error 

## 2021-09-14 ENCOUNTER — Ambulatory Visit (HOSPITAL_COMMUNITY): Payer: Medicaid Other

## 2021-09-14 DIAGNOSIS — R2689 Other abnormalities of gait and mobility: Secondary | ICD-10-CM | POA: Diagnosis not present

## 2021-09-14 DIAGNOSIS — M25561 Pain in right knee: Secondary | ICD-10-CM | POA: Diagnosis not present

## 2021-09-14 DIAGNOSIS — M6281 Muscle weakness (generalized): Secondary | ICD-10-CM | POA: Diagnosis not present

## 2021-09-14 DIAGNOSIS — R29898 Other symptoms and signs involving the musculoskeletal system: Secondary | ICD-10-CM | POA: Diagnosis not present

## 2021-09-14 NOTE — Therapy (Signed)
OUTPATIENT PHYSICAL THERAPY TREATMENT NOTE   Patient Name: Stephen Byrd MRN: [id number] DOB:[birth year], 21 y.o., male Today's Date: 09/14/2021  PCP: None REFERRING PROVIDER: Ramiro Harvest, MD  END OF SESSION:   PT End of Session - 09/14/21 1439     Visit Number 5    Number of Visits 12    Date for PT Re-Evaluation 10/05/21    Authorization Type [employer]    Authorization Time Period 27 combined visit limit per year    Authorization - Visit Number 4    Authorization - Number of Visits 27    PT Start Time 1435    PT Stop Time 1513    PT Time Calculation (min) 38 min    Activity Tolerance Patient tolerated treatment well;Patient limited by pain    Behavior During Therapy WFL for tasks assessed/performed               Past Medical History:  Diagnosis Date   ADHD (attention deficit hyperactivity disorder)    Bipolar 1 disorder (HCC)    Heart murmur    Past Surgical History:  Procedure Laterality Date   ACROMIO-CLAVICULAR JOINT REPAIR Left 02/13/2018   Procedure: LEFT SHOULDER ACROMIOCLAVICULAR RECONSTRUCTION;  Surgeon: Cammy Copa, MD;  Location: [facility] OR;  Service: Orthopedics;  Laterality: Left;   NO PAST SURGERIES     Patient Active Problem List   Diagnosis Date Noted   Substance use disorder 07/18/2021   Gonorrhea 07/18/2021   Septic joint (HCC) 07/17/2021    REFERRING DIAG:  M19.90 (ICD-10-CM) - Joint inflammation   THERAPY DIAG:  Other symptoms and signs involving the musculoskeletal system  Right knee pain, unspecified chronicity  Muscle weakness (generalized)  Other abnormalities of gait and mobility  PERTINENT HISTORY: Knee pain ,Right,  disseminated gonorrhea infection  Ankle Pain LEFT,  disseminated gonorrhea infection   PRECAUTIONS: None  SUBJECTIVE: Was able to run yesterday without pain; noticing improvements with all activity.  Goes by "[name]"  PAIN:  Are you having pain? Yes: NPRS scale: 0/10 Pain location: knees and  left ankle Aggravating factors: sit too long Relieving factors: movement   OBJECTIVE: (objective measures completed at initial evaluation unless otherwise dated)   OBJECTIVE:  COGNITION:           Overall cognitive status: Within functional limits for tasks assessed                          SENSATION: WFL     POSTURE:  Slouched in seated   PALPATION: TTP with edema throughout R knee, hypermobile patella medial/lateral R knee   LE ROM:   Active ROM Right 08/24/2021 Left 08/24/2021 Right 09/14/21 Left 09/14/21  Hip flexion        Hip extension        Hip abduction        Hip adduction        Hip internal rotation        Hip external rotation        Knee flexion 117 142 139 141  Knee extension 0 0    Ankle dorsiflexion        Ankle plantarflexion        Ankle inversion        Ankle eversion         (Blank rows = not tested)   LE MMT:   MMT Right 08/24/2021 Left 08/24/2021  Hip flexion 4+/5 5/5  Hip extension  Hip abduction      Hip adduction      Hip internal rotation      Hip external rotation      Knee flexion 4+/5 5/5  Knee extension 4/5 lacking TKE ROM 5/5  Ankle dorsiflexion 5/5 5/5  Ankle plantarflexion      Ankle inversion      Ankle eversion       (Blank rows = not tested)       FUNCTIONAL TESTS:  5 times sit to stand: 18.79 seconds without UE support Stairs: decreased strength and motor RLE with ascending/descending with intermittent unsteadiness Squat: able to squat to approx 45 degrees, limited    GAIT: Distance walked: 150 Assistive device utilized: None Level of assistance: Complete Independence Comments: antalgic       TODAY'S TREATMENT: 09/14/21 Bike seat 14 5 minute warm up Heel/toe raises 2 x 10 Slant board 5 x 20" bilaterally  Step ups 6" 2 x 10 leading Right leg TKE 5 plates x 20 Bodycraft Leg press 5 plates 3 x 10 Cybex Hamstring curls 4 plates 3 x 10 Air squat 2 x 10 Tandem stance on foam 1' each Hip vectors x 8  each 5" hold      09/07/21 Bike seat 14 5 minute warm up Standing Heelraises 20X Hip abduction 20X each  Hip Extension 20X each  Vector stance 5X5" each with 1 HHA  SLS each LE max Lt: 8", Rt: 26" without UE assist  Slant board 3X30" bilaterally   TKE 4PL 2X10 each LE Supine:  SLR Rt 3X10  SAQ Rt 2X10 5sec hold     09/02/21 Bike 14 x 6' warm up  Standing:    Heel raises 2 x 10   Slant board 5 x 30"  Hip ABD x 10 each leg  TKE green thera band x 20    Supine:  Quad sets x 20  Heel slides x 1'  Bridge with ball  hip Adduction x 10  Right SLR with assist 2 x 5   Prone:  Hip extension 2 x 10 each   Sidelying hip ABD x 10 each          08/31/21 Bike seat 14 x 5' warm up Review of goals and HEP  Supine: Quad sets 5" hold x 10 Heel slides x 1' SAQ X 10   Sit to stand 2 x 5 no UE assist  Standing TKE red thera-band x 10  Trial of kinesiotape R distal quad for swelling     PATIENT EDUCATION:  Education details: Patient educated on exam findings, POC, scope of PT, HEP, and gradual increases in activity as tolerated. Person educated: Patient Education method: Explanation, Demonstration, and Handouts Education comprehension: verbalized understanding, returned demonstration, verbal cues required, and tactile cues required       HOME EXERCISE PROGRAM: 09/02/21 Access Code: [email or web address] URL: [email or web address] Date: 09/02/2021 Prepared by: [facility] - Rehab  Exercises - Standing Heel Raise with Support  - 1 x daily - 7 x weekly - 2 sets - 10 reps - Supine Bridge with Mini Swiss Ball Between Knees  - 1 x daily - 7 x weekly - 1 sets - 10 reps - Supine Straight Leg Raises  - 1 x daily - 7 x weekly - 2 sets - 5 reps - Prone Hip Extension  - 1 x daily - 7 x weekly - 2 sets - 10 reps - Sidelying Hip Abduction  - 1 x daily - 7 x  weekly - 2 sets - 10 reps   4/25/23Access Code: [email or web address] Exercises - Supine Quadricep Sets  - 5 x daily - 7 x weekly - 10  reps - 5-10 second  hold - Supine Heel Slide  - 5 x daily - 7 x weekly - 2 sets - 10 reps - 5 second hold - Seated Long Arc Quad  - 5 x daily - 7 x weekly - 2 sets - 10 reps - 5 second hold   ASSESSMENT:   CLINICAL IMPRESSION: Today patient shows good improvement with AROM  knee flexion right knee.  Added strengthening exercises to include body craft leg press and hamstring curls.  Patient able to perform full air squats today without pain. Met 2/5 LTG's today for squat and AROM of Right knee.    Patient will continue to benefit from physical therapy in order to improve function and reduce impairment.     OBJECTIVE IMPAIRMENTS Abnormal gait, decreased activity tolerance, decreased balance, decreased mobility, difficulty walking, decreased ROM, decreased strength, increased edema, impaired flexibility, and improper body mechanics.    ACTIVITY LIMITATIONS cleaning, community activity, meal prep, occupation, yard work, and shopping.    PERSONAL FACTORS Time since onset of injury/illness/exacerbation and 1 comorbidity: joint inflammation  are also affecting patient's functional outcome.      REHAB POTENTIAL: Good      GOALS: Goals reviewed with patient? No   SHORT TERM GOALS: Target date: 09/14/2021   Patient will be independent with HEP in order to improve functional outcomes. Baseline:  Goal status: ongoing  2.  Patient will report at least 25% improvement in symptoms for improved quality of life. Baseline:  Goal status: met       LONG TERM GOALS: Target date: 10/05/2021   Patient will report at least 75% improvement in symptoms for improved quality of life. Baseline:  Goal status: ongoing   2.  Patient will demonstrate good squat mechanics below 90 degrees with proper mechanics for improved ability to interact with children. Baseline:  Goal status: met 5/16   3.  Patient will be able to navigate stairs with reciprocal pattern without compensation in order to demonstrate  improved LE strength. Baseline:  Goal status: ongoing   4.  Patient will be able to perform forward step down test without deviation in order to demonstrate improved LE strength and motor control.  Baseline: unable Goal status: ongoing   5.  Patient will improve ROM for R knee extension/flexion to 0-135 degrees to improve squatting, and other functional mobility. Baseline: 0-118 Goal status: met; 0-139 09/14/21       PLAN: PT FREQUENCY: 2x/week   PT DURATION: 6 weeks   PLANNED INTERVENTIONS: Therapeutic exercises, Therapeutic activity, Neuromuscular re-education, Balance training, Gait training, Patient/Family education, Joint manipulation, Joint mobilization, Stair training, Orthotic/Fit training, DME instructions, Aquatic Therapy, Dry Needling, Electrical stimulation, Spinal manipulation, Spinal mobilization, Cryotherapy, Moist heat, Compression bandaging, scar mobilization, Splintting, Taping, Traction, Ultrasound, Ionotophoresis 4mg /ml Dexamethasone, and Manual therapy     PLAN FOR NEXT SESSION: continue with lower extremity knee strength and mobility, progress as able.   Patient may be close to discharge; met AROM goal today; next visit try step down test. Step navigation.    3:14 PM, 09/14/21 Kaiyden Simkin Small Aeryn Medici MPT [facility] [place] [phone]

## 2021-09-16 ENCOUNTER — Ambulatory Visit (HOSPITAL_COMMUNITY): Payer: Medicaid Other

## 2021-09-16 ENCOUNTER — Telehealth (HOSPITAL_COMMUNITY): Payer: Self-pay

## 2021-09-16 NOTE — Telephone Encounter (Signed)
Attempted to call patient regarding no show on 09/16/21.  No voicemail box to leave message.

## 2021-09-21 ENCOUNTER — Ambulatory Visit (HOSPITAL_COMMUNITY): Payer: Medicaid Other | Admitting: Physical Therapy

## 2021-09-21 ENCOUNTER — Telehealth (HOSPITAL_COMMUNITY): Payer: Self-pay | Admitting: Physical Therapy

## 2021-09-21 NOTE — Telephone Encounter (Signed)
Patient no show, spoke with patient who stated he forgot he had an appointment today. Reminded him of next appointment and he confirmed.   11:34 AM, 09/21/21 Mearl Latin PT, DPT Physical Therapist at [facility]

## 2021-09-23 ENCOUNTER — Ambulatory Visit (HOSPITAL_COMMUNITY): Payer: Medicaid Other

## 2021-09-28 ENCOUNTER — Telehealth (HOSPITAL_COMMUNITY): Payer: Self-pay | Admitting: Physical Therapy

## 2021-09-28 ENCOUNTER — Encounter (HOSPITAL_COMMUNITY): Payer: Medicaid Other | Admitting: Physical Therapy

## 2021-09-28 ENCOUNTER — Encounter (HOSPITAL_COMMUNITY): Payer: Self-pay | Admitting: Physical Therapy

## 2021-09-28 NOTE — Therapy (Signed)
[facility] [address] [place], [place], [address] Phone: [phone]   Fax:  [phone]  Patient Details  Name: LARENZ FRASIER MRN: [id number] Date of Birth: 06-09-2000 Referring Provider:  No ref. provider found  Encounter Date: 09/28/2021  PHYSICAL THERAPY DISCHARGE SUMMARY  Visits from Start of Care: 5  Current functional level related to goals / functional outcomes: Unknown as patient has not returned.   Remaining deficits: Unknown as patient has not returned   Education / Equipment: HEP   Patient agrees to discharge. Patient goals were not met. Patient is being discharged due to not returning since the last visit. And no show policy.    11:53 AM, 09/28/21 Mearl Latin PT, DPT Physical Therapist at [facility] [place], [place], [address] Phone: [phone]   Fax:  [phone]

## 2021-09-28 NOTE — Telephone Encounter (Signed)
Patient no show, unable to reach patient despite multiple attempts. Patient has no showed several appointments and will be discharged per no show policy.  11:51 AM, 09/28/21 Wyman Songster PT, DPT Physical Therapist at [facility]

## 2021-09-30 ENCOUNTER — Encounter (HOSPITAL_COMMUNITY): Payer: Medicaid Other

## 2021-09-30 DIAGNOSIS — Z419 Encounter for procedure for purposes other than remedying health state, unspecified: Secondary | ICD-10-CM | POA: Diagnosis not present

## 2021-10-05 ENCOUNTER — Encounter (HOSPITAL_COMMUNITY): Payer: Medicaid Other

## 2021-10-07 ENCOUNTER — Ambulatory Visit (HOSPITAL_COMMUNITY): Payer: Medicaid Other | Admitting: Physical Therapy

## 2021-10-30 DIAGNOSIS — Z419 Encounter for procedure for purposes other than remedying health state, unspecified: Secondary | ICD-10-CM | POA: Diagnosis not present

## 2021-11-30 DIAGNOSIS — Z419 Encounter for procedure for purposes other than remedying health state, unspecified: Secondary | ICD-10-CM | POA: Diagnosis not present

## 2021-12-31 DIAGNOSIS — Z419 Encounter for procedure for purposes other than remedying health state, unspecified: Secondary | ICD-10-CM | POA: Diagnosis not present

## 2022-01-30 DIAGNOSIS — Z419 Encounter for procedure for purposes other than remedying health state, unspecified: Secondary | ICD-10-CM | POA: Diagnosis not present

## 2022-03-02 DIAGNOSIS — F4310 Post-traumatic stress disorder, unspecified: Secondary | ICD-10-CM | POA: Diagnosis not present

## 2022-03-02 DIAGNOSIS — Z419 Encounter for procedure for purposes other than remedying health state, unspecified: Secondary | ICD-10-CM | POA: Diagnosis not present

## 2022-03-02 DIAGNOSIS — F315 Bipolar disorder, current episode depressed, severe, with psychotic features: Secondary | ICD-10-CM | POA: Diagnosis not present

## 2022-04-07 ENCOUNTER — Other Ambulatory Visit: Payer: Self-pay

## 2022-04-07 ENCOUNTER — Emergency Department (HOSPITAL_COMMUNITY)
Admission: EM | Admit: 2022-04-07 | Discharge: 2022-04-07 | Discharge status: Against Medical Advice | Payer: Medicaid Other | Attending: Emergency Medicine | Admitting: Emergency Medicine

## 2022-04-07 ENCOUNTER — Encounter (HOSPITAL_COMMUNITY): Payer: Self-pay | Admitting: Emergency Medicine

## 2022-04-07 DIAGNOSIS — Z5321 Procedure and treatment not carried out due to patient leaving prior to being seen by health care provider: Secondary | ICD-10-CM | POA: Insufficient documentation

## 2022-04-07 DIAGNOSIS — R52 Pain, unspecified: Secondary | ICD-10-CM

## 2022-04-07 DIAGNOSIS — Z0279 Encounter for issue of other medical certificate: Secondary | ICD-10-CM | POA: Diagnosis not present

## 2022-04-07 NOTE — ED Notes (Signed)
Pt called 3 times with no answer- pt not in lobby

## 2022-04-07 NOTE — ED Triage Notes (Signed)
Pt states he's here for work note.

## 2022-11-15 ENCOUNTER — Emergency Department (HOSPITAL_COMMUNITY): Payer: MEDICAID

## 2022-11-15 ENCOUNTER — Other Ambulatory Visit: Payer: Self-pay

## 2022-11-15 ENCOUNTER — Emergency Department (HOSPITAL_COMMUNITY)
Admission: EM | Admit: 2022-11-15 | Discharge: 2022-11-15 | Disposition: A | Discharge status: Home / Self Care | Payer: MEDICAID | Attending: Emergency Medicine | Admitting: Emergency Medicine

## 2022-11-15 ENCOUNTER — Encounter (HOSPITAL_COMMUNITY): Payer: Self-pay | Admitting: *Deleted

## 2022-11-15 DIAGNOSIS — W25XXXA Contact with sharp glass, initial encounter: Secondary | ICD-10-CM | POA: Insufficient documentation

## 2022-11-15 DIAGNOSIS — S61411A Laceration without foreign body of right hand, initial encounter: Secondary | ICD-10-CM | POA: Insufficient documentation

## 2022-11-15 DIAGNOSIS — S61419D Laceration without foreign body of unspecified hand, subsequent encounter: Secondary | ICD-10-CM

## 2022-11-15 MED ORDER — BACITRACIN ZINC 500 UNIT/GM EX OINT
TOPICAL_OINTMENT | Freq: Once | CUTANEOUS | Status: AC
  Administered 2022-11-15: 1 via TOPICAL
  Filled 2022-11-15: qty 0.9

## 2022-11-15 NOTE — ED Notes (Signed)
Cleaned blood off patient hand

## 2022-11-15 NOTE — ED Triage Notes (Signed)
Pt brought in by [facility] with c/o right hand laceration from punching a mirror this morning. Hand wrapped in gauze by EMS, bleeding controlled. Tetanus shot approximately 1 year ago. Denies use of blood thinners.

## 2022-11-15 NOTE — ED Provider Notes (Signed)
[place] EMERGENCY DEPARTMENT AT [facility] Provider Note   CSN: [id number] Arrival date & time: 11/15/22  2536     History {Add pertinent medical, surgical, social history, OB history to HPI:1} Chief Complaint  Patient presents with   Extremity Laceration    Stephen Byrd is a 22 y.o. male.  Patient has no medical problems.  He put his hand through a window today.  Patient has lacerations to right hand   Hand Injury      Home Medications Prior to Admission medications   Medication Sig Start Date End Date Taking? Authorizing Provider  acetaminophen (TYLENOL) 325 MG tablet Take 2 tablets (650 mg total) by mouth every 6 (six) hours as needed for mild pain (or Fever >/= 101). 07/19/21   Rodolph Bong, MD  doxycycline (VIBRAMYCIN) 100 MG capsule Take 1 capsule (100 mg total) by mouth 2 (two) times daily. 08/24/21   Smoot, Shawn Route, PA-C  meloxicam (MOBIC) 15 MG tablet Take 1 tablet (15 mg total) by mouth daily. Patient not taking: Reported on 08/24/2021 02/17/21   Cammy Copa, MD  naproxen (NAPROSYN) 500 MG tablet Take 1 tablet (500 mg total) by mouth 2 (two) times daily. Patient not taking: Reported on 08/24/2021 03/16/19   Petrucelli, Pleas Koch, PA-C      Allergies    Patient has no known allergies.    Review of Systems   Review of Systems  Physical Exam Updated Vital Signs BP 117/77 (BP Location: Left Arm)   Pulse 61   Temp 98.4 F (36.9 C) (Oral)   Resp 14   Ht 5\' 6"  (1.676 m)   Wt 73.5 kg   SpO2 97%   BMI 26.15 kg/m  Physical Exam  ED Results / Procedures / Treatments   Labs (all labs ordered are listed, but only abnormal results are displayed) Labs Reviewed - No data to display  EKG None  Radiology DG Hand Complete Right  Result Date: 11/15/2022 CLINICAL DATA:  Right hand laceration from punching a meter. EXAM: RIGHT HAND - COMPLETE 3 VIEW COMPARISON:  None Available. FINDINGS: There is no evidence of fracture or dislocation.  There is no evidence of arthropathy or other focal bone abnormality. Soft tissues are unremarkable. IMPRESSION: Negative. Electronically Signed   By: Tiburcio Pea M.D.   On: 11/15/2022 07:52    Procedures Procedures  {Document cardiac monitor, telemetry assessment procedure when appropriate:1}  Medications Ordered in ED Medications - No data to display  ED Course/ Medical Decision Making/ A&P   {   Click here for ABCD2, HEART and other calculatorsREFRESH Note before signing :1}                          Medical Decision Making Amount and/or Complexity of Data Reviewed Radiology: ordered.   Patient has small nonsuturable lacerations to left hand.  He will be treated with bandages and Tylenol and follow-up as needed  {Document critical care time when appropriate:1} {Document review of labs and clinical decision tools ie heart score, Chads2Vasc2 etc:1}  {Document your independent review of radiology images, and any outside records:1} {Document your discussion with family members, caretakers, and with consultants:1} {Document social determinants of health affecting pt's care:1} {Document your decision making why or why not admission, treatments were needed:1} Final Clinical Impression(s) / ED Diagnoses Final diagnoses:  Laceration of hand without foreign body, unspecified laterality, subsequent encounter    Rx / DC Orders ED  Discharge Orders     None

## 2022-11-15 NOTE — Discharge Instructions (Signed)
Clean laceration twice a day with soap and water.  Take Tylenol or Motrin for pain.  Follow-up with Dr. Romeo Apple if any problems

## 2022-11-22 ENCOUNTER — Other Ambulatory Visit: Payer: Self-pay

## 2022-11-22 ENCOUNTER — Emergency Department (HOSPITAL_COMMUNITY): Payer: MEDICAID

## 2022-11-22 ENCOUNTER — Encounter (HOSPITAL_COMMUNITY): Payer: Self-pay | Admitting: Emergency Medicine

## 2022-11-22 ENCOUNTER — Emergency Department (HOSPITAL_COMMUNITY)
Admission: EM | Admit: 2022-11-22 | Discharge: 2022-11-22 | Disposition: A | Discharge status: Home / Self Care | Payer: MEDICAID | Attending: Emergency Medicine | Admitting: Emergency Medicine

## 2022-11-22 DIAGNOSIS — Y92002 Bathroom of unspecified non-institutional (private) residence single-family (private) house as the place of occurrence of the external cause: Secondary | ICD-10-CM | POA: Diagnosis not present

## 2022-11-22 DIAGNOSIS — W182XXA Fall in (into) shower or empty bathtub, initial encounter: Secondary | ICD-10-CM | POA: Insufficient documentation

## 2022-11-22 DIAGNOSIS — S8391XA Sprain of unspecified site of right knee, initial encounter: Secondary | ICD-10-CM | POA: Diagnosis not present

## 2022-11-22 DIAGNOSIS — Y93E1 Activity, personal bathing and showering: Secondary | ICD-10-CM | POA: Diagnosis not present

## 2022-11-22 DIAGNOSIS — M25561 Pain in right knee: Secondary | ICD-10-CM | POA: Diagnosis present

## 2022-11-22 MED ORDER — HYDROCODONE-ACETAMINOPHEN 5-325 MG PO TABS: 1.0000 | ORAL_TABLET | Freq: Four times a day (QID) | ORAL | 0 refills | Status: DC | PRN

## 2022-11-22 MED ORDER — NAPROXEN 500 MG PO TABS: 500.0000 mg | ORAL_TABLET | Freq: Two times a day (BID) | ORAL | 0 refills | Status: DC

## 2022-11-22 MED ORDER — HYDROCODONE-ACETAMINOPHEN 5-325 MG PO TABS
1.0000 | ORAL_TABLET | Freq: Once | ORAL | Status: AC
  Administered 2022-11-22: 1 via ORAL
  Filled 2022-11-22: qty 1

## 2022-11-22 NOTE — ED Triage Notes (Signed)
Pt brought in POV with family c/o right knee pain after mechanical fall tonight in the shower. Pt states he hasn't been able to bear weight on it since the fall.

## 2022-11-22 NOTE — ED Provider Notes (Signed)
[place] EMERGENCY DEPARTMENT AT [facility]  Provider Note  CSN: [id number] Arrival date & time: 11/22/22 2242  History Chief Complaint  Patient presents with   Stephen Byrd is a 22 y.o. male with no significant PMH reports he slipped and fell in the shower a short time prior to arrival injuring his R knee. Pain and swelling since then, worse with movement and unable to bear weight.    Home Medications Prior to Admission medications   Medication Sig Start Date End Date Taking? Authorizing Provider  HYDROcodone-acetaminophen (NORCO/VICODIN) 5-325 MG tablet Take 1 tablet by mouth every 6 (six) hours as needed for severe pain. 11/22/22  Yes Pollyann Savoy, MD  naproxen (NAPROSYN) 500 MG tablet Take 1 tablet (500 mg total) by mouth 2 (two) times daily. 11/22/22   Pollyann Savoy, MD     Allergies    Patient has no known allergies.   Review of Systems   Review of Systems Please see HPI for pertinent positives and negatives  Physical Exam BP 121/64 (BP Location: Right Arm)   Pulse 78   Temp 99.4 F (37.4 C) (Oral)   Resp 12   Ht 5\' 6"  (1.676 m)   Wt 73.5 kg   SpO2 96%   BMI 26.15 kg/m   Physical Exam Vitals and nursing note reviewed.  HENT:     Head: Normocephalic.     Nose: Nose normal.  Eyes:     Extraocular Movements: Extraocular movements intact.  Pulmonary:     Effort: Pulmonary effort is normal.  Musculoskeletal:        General: Swelling and tenderness (diffuse R knee, patient unable to tolerate ROM) present. No deformity.     Cervical back: Neck supple.  Skin:    Findings: No rash (on exposed skin).  Neurological:     Mental Status: He is alert and oriented to person, place, and time.  Psychiatric:        Mood and Affect: Mood normal.     ED Results / Procedures / Treatments   EKG None  Procedures Procedures  Medications Ordered in the ED Medications  HYDROcodone-acetaminophen (NORCO/VICODIN) 5-325 MG per tablet 1  tablet (1 tablet Oral Given 11/22/22 2320)    Initial Impression and Plan  Patient here with mechanical fall and R knee injury. Exam is limited by pain. Will give Norco and check xray.   ED Course   Clinical Course as of 11/22/22 2338  Tue Nov 22, 2022  2336 I personally viewed the images from radiology studies and agree with radiologist interpretation: Xray neg for fracture. Will place in knee brace, crutches, Rx for norco and Ortho follow up..  [CS]    Clinical Course User Index [CS] Pollyann Savoy, MD     MDM Rules/Calculators/A&P Medical Decision Making Problems Addressed: Sprain of right knee, unspecified ligament, initial encounter: acute illness or injury  Amount and/or Complexity of Data Reviewed Radiology: ordered and independent interpretation performed. Decision-making details documented in ED Course.  Risk Prescription drug management.     Final Clinical Impression(s) / ED Diagnoses Final diagnoses:  Sprain of right knee, unspecified ligament, initial encounter    Rx / DC Orders ED Discharge Orders          Ordered    naproxen (NAPROSYN) 500 MG tablet  2 times daily,   Status:  Discontinued        11/22/22 2337    HYDROcodone-acetaminophen (NORCO/VICODIN) 5-325 MG  tablet  Every 6 hours PRN        11/22/22 2337    naproxen (NAPROSYN) 500 MG tablet  2 times daily        11/22/22 2338             Pollyann Savoy, MD 11/22/22 [id number]

## 2022-11-30 ENCOUNTER — Ambulatory Visit: Payer: MEDICAID | Admitting: Orthopedic Surgery

## 2022-11-30 ENCOUNTER — Encounter: Payer: Self-pay | Admitting: Orthopedic Surgery

## 2022-11-30 VITALS — BP 122/88 | HR 48 | Ht 67.0 in | Wt 155.0 lb

## 2022-11-30 DIAGNOSIS — M25562 Pain in left knee: Secondary | ICD-10-CM

## 2022-11-30 DIAGNOSIS — S83511A Sprain of anterior cruciate ligament of right knee, initial encounter: Secondary | ICD-10-CM

## 2022-11-30 DIAGNOSIS — S83241A Other tear of medial meniscus, current injury, right knee, initial encounter: Secondary | ICD-10-CM

## 2022-11-30 MED ORDER — NAPROXEN 500 MG PO TABS: 500.0000 mg | ORAL_TABLET | Freq: Two times a day (BID) | ORAL | 0 refills | Status: DC

## 2022-11-30 MED ORDER — HYDROCODONE-ACETAMINOPHEN 5-325 MG PO TABS: 1.0000 | ORAL_TABLET | Freq: Three times a day (TID) | ORAL | 0 refills | Status: DC | PRN

## 2022-11-30 NOTE — Progress Notes (Signed)
Chief Complaint  Patient presents with   Knee Pain    Right/ fell in shower 11/22/22   22 year old male presents as an ER follow-up.  He was in the shower and fell and twisted his right knee.  He had immediate pain and swelling and could not weight-bear.  Since then he is used ice and the swelling is gone down he is walking in a knee brace still complaining of pain.  He has a previous history of infection in the right knee which required IV antibiotics  Review of systems is negative for chest pain shortness of breath numbness tingling fever or malaise  Medical history is negative Past Medical History:  Diagnosis Date   ADHD (attention deficit hyperactivity disorder)    Bipolar 1 disorder (HCC)    Heart murmur    BP 122/88   Pulse (!) 48   Ht 5\' 7"  (1.702 m)   Wt 155 lb (70.3 kg)   BMI 24.28 kg/m   Normal development grooming and hygiene  He is awake alert and oriented x 3 with pleasant mood  He is ambulatory with a brace he has a slight limp  Does have a trace effusion in the right knee and tenderness over the medial joint line patellofemoral joint seems stable as does the PCL and collateral ligaments  He had some pain with the anterior drawer and Lachman test  Neurovascular exam is otherwise intact  Outside imaging plain films of the knee show no fracture dislocation or acute bony fracture  Impression  Rule out meniscus tear, medial patellofemoral ligament tear, ACL tear  RULE OUT: Encounter Diagnoses  Name Primary?   Acute pain of left knee Yes   Rupture of anterior cruciate ligament of right knee, initial encounter    Tear of medial meniscus of right knee, current, unspecified tear type, initial encounter      Continue naproxen hydrocodone as needed hinged knee brace and ice follow-up after MRI results

## 2022-11-30 NOTE — Patient Instructions (Signed)
While we are working on your approval for MRI please go ahead and call to schedule your appointment with [facility] within at least one (1) week.   Central Scheduling [phone]  

## 2022-12-21 ENCOUNTER — Ambulatory Visit (HOSPITAL_COMMUNITY): Admission: RE | Admit: 2022-12-21 | Payer: MEDICAID | Source: Ambulatory Visit

## 2023-02-03 ENCOUNTER — Telehealth: Payer: Self-pay

## 2023-02-03 NOTE — Telephone Encounter (Signed)
Was not able to LVM, phone not in service

## 2023-02-08 ENCOUNTER — Telehealth: Payer: Self-pay

## 2023-02-18 ENCOUNTER — Encounter (HOSPITAL_COMMUNITY): Payer: Self-pay

## 2023-02-18 ENCOUNTER — Other Ambulatory Visit: Payer: Self-pay

## 2023-02-18 ENCOUNTER — Emergency Department (HOSPITAL_COMMUNITY)
Admission: EM | Admit: 2023-02-18 | Discharge: 2023-02-18 | Disposition: A | Discharge status: Home / Self Care | Payer: MEDICAID | Attending: Emergency Medicine | Admitting: Emergency Medicine

## 2023-02-18 DIAGNOSIS — N342 Other urethritis: Secondary | ICD-10-CM | POA: Diagnosis not present

## 2023-02-18 DIAGNOSIS — M25461 Effusion, right knee: Secondary | ICD-10-CM | POA: Diagnosis not present

## 2023-02-18 DIAGNOSIS — M25561 Pain in right knee: Secondary | ICD-10-CM | POA: Diagnosis present

## 2023-02-18 LAB — CBC WITH DIFFERENTIAL/PLATELET
Abs Immature Granulocytes: 0.03 10*3/uL (ref 0.00–0.07)
Basophils Absolute: 0.1 10*3/uL (ref 0.0–0.1)
Basophils Relative: 1 %
Eosinophils Absolute: 0.2 10*3/uL (ref 0.0–0.5)
Eosinophils Relative: 2 %
HCT: 38.5 % — ABNORMAL LOW (ref 39.0–52.0)
Hemoglobin: 12.3 g/dL — ABNORMAL LOW (ref 13.0–17.0)
Immature Granulocytes: 0 %
Lymphocytes Relative: 20 %
Lymphs Abs: 1.5 10*3/uL (ref 0.7–4.0)
MCH: 28 pg (ref 26.0–34.0)
MCHC: 31.9 g/dL (ref 30.0–36.0)
MCV: 87.7 fL (ref 80.0–100.0)
Monocytes Absolute: 0.9 10*3/uL (ref 0.1–1.0)
Monocytes Relative: 11 %
Neutro Abs: 5.1 10*3/uL (ref 1.7–7.7)
Neutrophils Relative %: 66 %
Platelets: 204 10*3/uL (ref 150–400)
RBC: 4.39 MIL/uL (ref 4.22–5.81)
RDW: 13.4 % (ref 11.5–15.5)
WBC: 7.7 10*3/uL (ref 4.0–10.5)
nRBC: 0 % (ref 0.0–0.2)

## 2023-02-18 LAB — BODY FLUID CELL COUNT WITH DIFFERENTIAL
Eos, Fluid: 0 %
Lymphs, Fluid: 0 %
Monocyte-Macrophage-Serous Fluid: 13 % — ABNORMAL LOW (ref 50–90)
Neutrophil Count, Fluid: 87 % — ABNORMAL HIGH (ref 0–25)
Other Cells, Fluid: NONE SEEN %
Total Nucleated Cell Count, Fluid: 4680 uL — ABNORMAL HIGH (ref 0–1000)

## 2023-02-18 LAB — BASIC METABOLIC PANEL
Anion gap: 5 (ref 5–15)
BUN: 10 mg/dL (ref 6–20)
CO2: 29 mmol/L (ref 22–32)
Calcium: 8.7 mg/dL — ABNORMAL LOW (ref 8.9–10.3)
Chloride: 103 mmol/L (ref 98–111)
Creatinine, Ser: 0.95 mg/dL (ref 0.61–1.24)
GFR, Estimated: >60 mL/min (ref >60)
Glucose, Bld: 89 mg/dL (ref 70–99)
Potassium: 3.9 mmol/L (ref 3.5–5.1)
Sodium: 137 mmol/L (ref 135–145)

## 2023-02-18 LAB — URINALYSIS, ROUTINE W REFLEX MICROSCOPIC
Bilirubin Urine: NEGATIVE
Glucose, UA: NEGATIVE mg/dL
Hgb urine dipstick: NEGATIVE
Ketones, ur: NEGATIVE mg/dL
Nitrite: NEGATIVE
Protein, ur: 30 mg/dL — AB
Specific Gravity, Urine: 1.018 (ref 1.005–1.030)
WBC, UA: >50 WBC/hpf (ref 0–5)
pH: 8 (ref 5.0–8.0)

## 2023-02-18 LAB — C-REACTIVE PROTEIN: CRP: 1.3 mg/dL — ABNORMAL HIGH (ref <1.0)

## 2023-02-18 LAB — SYNOVIAL CELL COUNT + DIFF, W/ CRYSTALS
Crystals, Fluid: NONE SEEN
Eosinophils-Synovial: 0 % (ref 0–1)
Lymphocytes-Synovial Fld: 0 % (ref 0–20)
Monocyte-Macrophage-Synovial Fluid: 13 % — ABNORMAL LOW (ref 50–90)
Neutrophil, Synovial: 87 % — ABNORMAL HIGH (ref 0–25)
Other Cells-SYN: NONE SEEN
WBC, Synovial: 4680 /mm3 — ABNORMAL HIGH (ref 0–200)

## 2023-02-18 LAB — HIV ANTIBODY (ROUTINE TESTING W REFLEX): HIV Screen 4th Generation wRfx: NONREACTIVE

## 2023-02-18 LAB — SEDIMENTATION RATE: Sed Rate: 3 mm/h (ref 0–16)

## 2023-02-18 MED ORDER — KETOROLAC TROMETHAMINE 10 MG PO TABS
10.0000 mg | ORAL_TABLET | Freq: Once | ORAL | Status: AC
  Administered 2023-02-18: 10 mg via ORAL
  Filled 2023-02-18: qty 1

## 2023-02-18 MED ORDER — STERILE WATER FOR INJECTION IJ SOLN
INTRAMUSCULAR | Status: AC
  Administered 2023-02-18: 10 mL
  Filled 2023-02-18: qty 10

## 2023-02-18 MED ORDER — LIDOCAINE HCL (PF) 1 % IJ SOLN
30.0000 mL | Freq: Once | INTRAMUSCULAR | Status: AC
  Administered 2023-02-18: 30 mL
  Filled 2023-02-18: qty 30

## 2023-02-18 MED ORDER — NAPROXEN 500 MG PO TABS: 500.0000 mg | ORAL_TABLET | Freq: Two times a day (BID) | ORAL | 0 refills | Status: DC

## 2023-02-18 MED ORDER — DOXYCYCLINE HYCLATE 100 MG PO TABS
100.0000 mg | ORAL_TABLET | Freq: Once | ORAL | Status: AC
  Administered 2023-02-18: 100 mg via ORAL
  Filled 2023-02-18: qty 1

## 2023-02-18 MED ORDER — DOXYCYCLINE HYCLATE 100 MG PO CAPS: 100.0000 mg | ORAL_CAPSULE | Freq: Two times a day (BID) | ORAL | 0 refills | Status: AC

## 2023-02-18 MED ORDER — CEFTRIAXONE SODIUM 500 MG IJ SOLR
500.0000 mg | Freq: Once | INTRAMUSCULAR | Status: AC
  Administered 2023-02-18: 500 mg via INTRAMUSCULAR
  Filled 2023-02-18: qty 500

## 2023-02-18 NOTE — ED Triage Notes (Signed)
Pt states rt knee pain and left ankle. Pt has hurt knee previously and suppose to f/u with Dr. Romeo Apple but never has. Pt states he was informed not to exert and play basketball on his knee and does anyways. Pt is also here for a STI check states "it's been a while."

## 2023-02-18 NOTE — Discharge Instructions (Signed)
You are seen in the ER today for multiple things first you are seen for seen for swelling in your right knee.  There is obviously some inflammation but does not appear to be infected which is good.  We are still awaiting a culture.  Please follow-up with orthopedics who you are already established with for your ongoing knee problems.  Second you are seen for penile drainage.  We are treating you for gonorrhea and chlamydia is important to be compliant and take the full course of antibiotics.  Need to follow-up with your primary care doctor and/or the health department for complete STI testing.  We did send routine testing for syphilis and HIV which she can follow-up with on MyChart.  If these are positive there is a process in place to call you and give you results but unfortunately it is not a preferred process so I encouraged you to follow-up on your own as well.  Develop fever, worsening pain, numbness or tingling or other complaints come back to the ER.  Abstain from having any sexual contact until you have been fully treated and results are back.  You should let your partners know your symptoms as well so they can be treated and tested.

## 2023-02-18 NOTE — ED Provider Notes (Cosign Needed Addendum)
[place] EMERGENCY DEPARTMENT AT [facility] Provider Note   CSN: [id number] Arrival date & time: 02/18/23  1158     History  Chief Complaint  Patient presents with   Knee Pain    Shepard Sadd Froemming is a 22 y.o. male.  He presents the ER today for right knee and left ankle swelling since yesterday, states has had this in the past.  Also reports 1 week of white to green drainage from the tip of his penis.  Orts he did have a new sexual partner about a week ago.  Denies fever or chills, denies dysuria or abdominal pain.  Notes one-time he had to be admitted to the hospital for IV antibiotics when his knee and ankle are swollen like this.   EMR review shows he had been positive for gonorrhea at that time but was treated with Rocephin and doxycycline.  This was in March 2023.  He is told to follow-up with orthopedics for his knee pain but has never followed up.   Knee Pain      Home Medications Prior to Admission medications   Medication Sig Start Date End Date Taking? Authorizing Provider  doxycycline (VIBRAMYCIN) 100 MG capsule Take 1 capsule (100 mg total) by mouth 2 (two) times daily for 7 days. 02/18/23 02/25/23 Yes Elanora Quin A, PA-C  naproxen (NAPROSYN) 500 MG tablet Take 1 tablet (500 mg total) by mouth 2 (two) times daily with a meal. 02/18/23  Yes Lodema Parma A, PA-C  HYDROcodone-acetaminophen (NORCO/VICODIN) 5-325 MG tablet Take 1 tablet by mouth every 8 (eight) hours as needed for severe pain. 11/30/22   Vickki Hearing, MD  naproxen (NAPROSYN) 500 MG tablet Take 1 tablet (500 mg total) by mouth 2 (two) times daily. 11/30/22   Vickki Hearing, MD      Allergies    Patient has no known allergies.    Review of Systems   Review of Systems  Physical Exam Updated Vital Signs BP 118/77   Pulse (!) 59   Temp 98 F (36.7 C) (Oral)   Resp 18   Ht 5\' 7"  (1.702 m)   Wt 70.3 kg   SpO2 99%   BMI 24.28 kg/m  Physical Exam Vitals and nursing note  reviewed. Exam conducted with a chaperone present.  Constitutional:      General: He is not in acute distress.    Appearance: He is well-developed.  HENT:     Head: Normocephalic and atraumatic.     Mouth/Throat:     Mouth: Mucous membranes are moist.  Eyes:     Conjunctiva/sclera: Conjunctivae normal.  Cardiovascular:     Rate and Rhythm: Normal rate and regular rhythm.     Heart sounds: No murmur heard. Pulmonary:     Effort: Pulmonary effort is normal. No respiratory distress.     Breath sounds: Normal breath sounds.  Abdominal:     Palpations: Abdomen is soft.     Tenderness: There is no abdominal tenderness.  Genitourinary:    Penis: Circumcised. Discharge present. No erythema, swelling or lesions.      Testes: Normal.  Musculoskeletal:        General: No swelling.     Cervical back: Neck supple.  Skin:    General: Skin is warm and dry.     Capillary Refill: Capillary refill takes less than 2 seconds.  Neurological:     General: No focal deficit present.     Mental Status: He is alert  and oriented to person, place, and time.  Psychiatric:        Mood and Affect: Mood normal.     ED Results / Procedures / Treatments   Labs (all labs ordered are listed, but only abnormal results are displayed) Labs Reviewed  URINALYSIS, ROUTINE W REFLEX MICROSCOPIC - Abnormal; Notable for the following components:      Result Value   APPearance HAZY (*)    Protein, ur 30 (*)    Leukocytes,Ua MODERATE (*)    Bacteria, UA RARE (*)    All other components within normal limits  BASIC METABOLIC PANEL - Abnormal; Notable for the following components:   Calcium 8.7 (*)    All other components within normal limits  CBC WITH DIFFERENTIAL/PLATELET - Abnormal; Notable for the following components:   Hemoglobin 12.3 (*)    HCT 38.5 (*)    All other components within normal limits  BODY FLUID CELL COUNT WITH DIFFERENTIAL - Abnormal; Notable for the following components:   Appearance,  Fluid TURBID (*)    Total Nucleated Cell Count, Fluid 4,680 (*)    Neutrophil Count, Fluid 87 (*)    Monocyte-Macrophage-Serous Fluid 13 (*)    All other components within normal limits  SYNOVIAL CELL COUNT + DIFF, W/ CRYSTALS - Abnormal; Notable for the following components:   Appearance-Synovial TURBID (*)    WBC, Synovial 4,680 (*)    Neutrophil, Synovial 87 (*)    Monocyte-Macrophage-Synovial Fluid 13 (*)    All other components within normal limits  BODY FLUID CULTURE W GRAM STAIN  URINE CULTURE  SEDIMENTATION RATE  HIV ANTIBODY (ROUTINE TESTING W REFLEX)  RPR  C-REACTIVE PROTEIN  GLUCOSE, BODY FLUID OTHER            PROTEIN, BODY FLUID (OTHER)  GC/CHLAMYDIA PROBE AMP ([place]) NOT AT [facility]    EKG None  Radiology No results found.  Procedures .Joint Aspiration/Arthrocentesis  Date/Time: 02/18/2023 7:19 PM  Performed by: Ma Rings, PA-C Authorized by: Ma Rings, PA-C   Consent:    Consent obtained:  Verbal   Consent given by:  Patient   Risks, benefits, and alternatives were discussed: yes     Risks discussed:  Bleeding, infection, pain, nerve damage and incomplete drainage   Alternatives discussed:  No treatment Universal protocol:    Procedure explained and questions answered to patient or proxy's satisfaction: yes     Site/side marked: yes     Patient identity confirmed:  Verbally with patient Location:    Location:  Knee Anesthesia:    Anesthesia method:  Local infiltration   Local anesthetic:  Lidocaine 1% w/o epi Procedure details:    Preparation: Patient was prepped and draped in usual sterile fashion     Needle gauge:  18 G   Ultrasound guidance: no     Approach:  Lateral   Aspirate characteristics:  Blood-tinged, yellow and cloudy   Steroid injected: no     Specimen collected: yes   Post-procedure details:    Dressing:  Adhesive bandage   Procedure completion:  Tolerated well, no immediate complications     Medications  Ordered in ED Medications  ketorolac (TORADOL) tablet 10 mg (10 mg Oral Given 02/18/23 1402)  lidocaine (PF) (XYLOCAINE) 1 % injection 30 mL (30 mLs Infiltration Given 02/18/23 1642)  cefTRIAXone (ROCEPHIN) injection 500 mg (500 mg Intramuscular Given 02/18/23 1846)  doxycycline (VIBRA-TABS) tablet 100 mg (100 mg Oral Given 02/18/23 1845)  sterile water (preservative free) injection (  10 mLs  Given 02/18/23 1846)    ED Course/ Medical Decision Making/ A&P                                 Medical Decision Making Ddx: Tendinitis, bursitis, sprain, contusion, fracture, septic arthritis, reactive arthritis, inflammatory arthritis, gout, other  ED course: This is a 22 year old male with complaints of right knee and left ankle swelling, states has had this follow-up concurrently in the past also complaining of penile discharge for about a week with recent new sexual partner.  Record review shows he was admitted and treated for possible gonococcal arthritis but a year and a half ago and never follow-up with orthopedics after that.  He is clinically well-appearing, has some left knee swelling but has good range of motion and no redness.  Able to fully bear full weight, normal range of motion and bear weight though states it is painful.  Given his history there is concern for possible gonococcal arthritis.  Although his exam is not consistent with a septic arthritis.  Will get some basic labs.  And to reevaluate.  Of note he does not have any redness or drainage to his eyes no skin rash.  Labs reassuring but due to large effusion decided to go ahead with arthrocentesis, patient advised of risk and benefits and was agreeable with going ahead with this.  The synovial fluid results show white blood cells of 4680 g stagnating, no other cells and no crystals which is well below the threshold of 50,000 for septic arthritis especially given his reassuring exam.  I consulted with Dr. Dallas Schimke from orthopedics was  agreeable with discharge home outpatient follow-up unless culture comes back in which case patient would be called.  Sed rate was only 3.  He does feel better after the paracentesis.  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  Will treat for GC and chlamydia and advised on follow-up and safe sex, as partner notification.  Patient advised on the importance of medication compliance.  Is on follow-up with strict return precautions.  Already been seen by Dr. Romeo Apple in orthopedic clinic once and advised to follow-up again.  Was supposed have MRI for possible soft tissue injury.  Given strict return precautions. Urinalysis shows greater than 50 white blood cells, 11-20 red blood cells moderate leuks and rare bacteria.  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  Urine sent for culture however.  He is being treated with Rocephin and doxycycline.    Amount and/or Complexity of Data Reviewed External Data Reviewed: labs, radiology and notes. Labs: ordered. Decision-making details documented in ED Course.  Risk Prescription drug management.           Final Clinical Impression(s) / ED Diagnoses Final diagnoses:  Urethritis  Effusion of right knee    Rx / DC Orders ED Discharge Orders          Ordered    doxycycline (VIBRAMYCIN) 100 MG capsule  2 times daily        02/18/23 1910    naproxen (NAPROSYN) 500 MG tablet  2 times daily with meals        02/18/23 1914              Ma Rings, PA-C 02/18/23 1926    Josem Kaufmann 02/18/23 1959    Hyacinth Meeker,  Arlys John, MD 02/19/23 1450

## 2023-02-19 LAB — RPR: RPR Ser Ql: NONREACTIVE

## 2023-02-20 LAB — GC/CHLAMYDIA PROBE AMP (~~LOC~~) NOT AT ARMC
Chlamydia: NEGATIVE
Comment: NEGATIVE
Comment: NORMAL
Neisseria Gonorrhea: POSITIVE — AB

## 2023-02-20 LAB — PROTEIN, BODY FLUID (OTHER): Total Protein, Body Fluid Other: 3.3 g/dL

## 2023-02-20 LAB — GLUCOSE, BODY FLUID OTHER: Glucose, Body Fluid Other: 75 mg/dL

## 2023-02-22 LAB — BODY FLUID CULTURE W GRAM STAIN: Gram Stain: NONE SEEN

## 2023-11-04 ENCOUNTER — Emergency Department (HOSPITAL_COMMUNITY)
Admission: EM | Admit: 2023-11-04 | Discharge: 2023-11-05 | Disposition: A | Discharge status: Home / Self Care | Payer: MEDICAID | Attending: Emergency Medicine | Admitting: Emergency Medicine

## 2023-11-04 ENCOUNTER — Other Ambulatory Visit: Payer: Self-pay

## 2023-11-04 ENCOUNTER — Encounter (HOSPITAL_COMMUNITY): Payer: Self-pay | Admitting: Emergency Medicine

## 2023-11-04 DIAGNOSIS — R369 Urethral discharge, unspecified: Secondary | ICD-10-CM | POA: Diagnosis present

## 2023-11-04 DIAGNOSIS — Z202 Contact with and (suspected) exposure to infections with a predominantly sexual mode of transmission: Secondary | ICD-10-CM | POA: Insufficient documentation

## 2023-11-04 LAB — URINALYSIS, ROUTINE W REFLEX MICROSCOPIC
Bacteria, UA: NONE SEEN
Bilirubin Urine: NEGATIVE
Glucose, UA: NEGATIVE mg/dL
Ketones, ur: NEGATIVE mg/dL
Nitrite: NEGATIVE
Protein, ur: 30 mg/dL — AB
Specific Gravity, Urine: 1.02 (ref 1.005–1.030)
WBC, UA: >50 WBC/hpf (ref 0–5)
pH: 5 (ref 5.0–8.0)

## 2023-11-04 NOTE — ED Triage Notes (Signed)
 Patient coming to ED for evaluation of penile discharge.  Reports his partner gave me something.  Symptoms started a few days ago.  No reports of pain.

## 2023-11-05 MED ORDER — DOXYCYCLINE HYCLATE 100 MG PO CAPS: 100.0000 mg | ORAL_CAPSULE | Freq: Two times a day (BID) | ORAL | 0 refills | Status: AC

## 2023-11-05 MED ORDER — CEFTRIAXONE SODIUM 500 MG IJ SOLR
500.0000 mg | Freq: Once | INTRAMUSCULAR | Status: AC
  Administered 2023-11-05: 500 mg via INTRAMUSCULAR
  Filled 2023-11-05: qty 500

## 2023-11-05 MED ORDER — LIDOCAINE HCL (PF) 1 % IJ SOLN
INTRAMUSCULAR | Status: AC
  Administered 2023-11-05: 5 mL
  Filled 2023-11-05: qty 5

## 2023-11-05 NOTE — Discharge Instructions (Signed)
 You were evaluated in the Emergency Department and after careful evaluation, we did not find any emergent condition requiring admission or further testing in the hospital.  Your exam/testing today is overall reassuring.  Symptoms may be due to a sexually transmitted infection.  Use the doxycycline  twice daily as directed.  Avoid sexual contact while on this medication.  Use condoms.  Please return to the Emergency Department if you experience any worsening of your condition.   Thank you for allowing us  to be a part of your care.

## 2023-11-05 NOTE — ED Provider Notes (Signed)
 [facility]-EM[facility]GENCY DEPT [facility] Emergency Department Provider Note MRN:  [id number]  Arrival date & time: 11/05/23     Chief Complaint   Penile Discharge   History of Present Illness   Stephen Byrd is a 23 y.o. year-old male with a history of bipolar disorder presenting to the ED with chief complaint of penile discharge.  Penile discharge for the past couple days.  New sexual partner.  No rash, no fever, no other complaints.  Review of Systems  A thorough review of systems was obtained and all systems are negative except as noted in the HPI and PMH.   Patient's Health History    Past Medical History:  Diagnosis Date   ADHD (attention deficit hyperactivity disorder)    Bipolar 1 disorder (HCC)    Heart murmur     Past Surgical History:  Procedure Laterality Date   ACROMIO-CLAVICULAR JOINT REPAIR Left 02/13/2018   Procedure: LEFT SHOULDER ACROMIOCLAVICULAR RECONSTRUCTION;  Surgeon: Addie Cordella Hamilton, MD;  Location: [facility] OR;  Service: Orthopedics;  Laterality: Left;   NO PAST SURGERIES      Family History  Problem Relation Age of Onset   Hypertension Mother    Hypertension Maternal Grandfather     Social History   Socioeconomic History   Marital status: Single    Spouse name: Not on file   Number of children: Not on file   Years of education: Not on file   Highest education level: Not on file  Occupational History   Not on file  Tobacco Use   Smoking status: Never   Smokeless tobacco: Never  Vaping Use   Vaping status: Never Used  Substance and Sexual Activity   Alcohol use: Yes    Comment: occasionally   Drug use: Yes    Frequency: 7.0 times per week    Types: Marijuana   Sexual activity: Not on file  Other Topics Concern   Not on file  Social History Narrative   Not on file   Social Drivers of Health   Financial Resource Strain: Not on file  Food Insecurity: Not on file  Transportation Needs: Not on file  Physical Activity: Not on file   Stress: Not on file  Social Connections: Not on file  Intimate Partner Violence: Not on file     Physical Exam   Vitals:   11/04/23 2250  BP: 122/79  Pulse: 75  Resp: 16  Temp: 98.7 F (37.1 C)  SpO2: 97%    CONSTITUTIONAL: Well-appearing, NAD NEURO/PSYCH:  Alert and oriented x 3, no focal deficits EYES:  eyes equal and reactive ENT/NECK:  no LAD, no JVD CARDIO: Regular rate, well-perfused, normal S1 and S2 PULM:  CTAB no wheezing or rhonchi GI/GU:  non-distended, non-tender MSK/SPINE:  No gross deformities, no edema SKIN:  no rash, atraumatic   *Additional and/or pertinent findings included in MDM below  Diagnostic and Interventional Summary    EKG Interpretation Date/Time:    Ventricular Rate:    PR Interval:    QRS Duration:    QT Interval:    QTC Calculation:   R Axis:      Text Interpretation:         Labs Reviewed  URINALYSIS, ROUTINE W REFLEX MICROSCOPIC - Abnormal; Notable for the following components:      Result Value   APPearance CLOUDY (*)    Hgb urine dipstick MODERATE (*)    Protein, ur 30 (*)    Leukocytes,Ua LARGE (*)    All  other components within normal limits  GC/CHLAMYDIA PROBE AMP ([place]) NOT AT [facility]    No orders to display    Medications  cefTRIAXone  (ROCEPHIN ) injection 500 mg (has no administration in time range)     Procedures  /  Critical Care Procedures  ED Course and Medical Decision Making  Initial Impression and Ddx Vitals normal well-appearing patient declines genital exam, denies any lymphadenopathy or testicular pain, simply remittent penile discharge for a few days.  With new sexual contact, suspicious for STD.  Past medical/surgical history that increases complexity of ED encounter: None  Interpretation of Diagnostics I personally reviewed the Laboratory Testing and my interpretation is as follows: UA with WBCs    Patient Reassessment and Ultimate Disposition/Management     Discharge  Patient  management required discussion with the following services or consulting groups:  None  Complexity of Problems Addressed Acute complicated illness or Injury  Additional Data Reviewed and Analyzed Further history obtained from: None  Additional Factors Impacting ED Encounter Risk Prescriptions  Ozell HERO. Theadore, MD [facility] Emergency Medicine [facility] [email or web address]  Final Clinical Impressions(s) / ED Diagnoses     ICD-10-CM   1. Potential exposure to STD  Z20.2       ED Discharge Orders          Ordered    doxycycline  (VIBRAMYCIN ) 100 MG capsule  2 times daily        11/05/23 0054             Discharge Instructions Discussed with and Provided to Patient:    Discharge Instructions      You were evaluated in the Emergency Department and after careful evaluation, we did not find any emergent condition requiring admission or further testing in the hospital.  Your exam/testing today is overall reassuring.  Symptoms may be due to a sexually transmitted infection.  Use the doxycycline  twice daily as directed.  Avoid sexual contact while on this medication.  Use condoms.  Please return to the Emergency Department if you experience any worsening of your condition.   Thank you for allowing us  to be a part of your care.      Theadore Ozell HERO, MD 11/05/23 [phone]

## 2023-11-06 LAB — GC/CHLAMYDIA PROBE AMP (~~LOC~~) NOT AT ARMC
Chlamydia: NEGATIVE
Comment: NEGATIVE
Comment: NORMAL
Neisseria Gonorrhea: POSITIVE — AB

## 2023-11-07 ENCOUNTER — Emergency Department (HOSPITAL_COMMUNITY): Payer: MEDICAID

## 2023-11-07 ENCOUNTER — Emergency Department (HOSPITAL_COMMUNITY)
Admission: EM | Admit: 2023-11-07 | Discharge: 2023-11-07 | Disposition: A | Discharge status: Home / Self Care | Payer: MEDICAID | Attending: Emergency Medicine | Admitting: Emergency Medicine

## 2023-11-07 ENCOUNTER — Other Ambulatory Visit: Payer: Self-pay

## 2023-11-07 ENCOUNTER — Encounter (HOSPITAL_COMMUNITY): Payer: Self-pay

## 2023-11-07 DIAGNOSIS — M25572 Pain in left ankle and joints of left foot: Secondary | ICD-10-CM | POA: Insufficient documentation

## 2023-11-07 MED ORDER — NAPROXEN 500 MG PO TABS: 500.0000 mg | ORAL_TABLET | Freq: Two times a day (BID) | ORAL | 0 refills | Status: AC

## 2023-11-07 MED ORDER — HYDROCODONE-ACETAMINOPHEN 5-325 MG PO TABS: 1.0000 | ORAL_TABLET | Freq: Four times a day (QID) | ORAL | 0 refills | Status: AC | PRN

## 2023-11-07 NOTE — ED Triage Notes (Signed)
 Pt states that earlier today he started having left ankle pain out of nowhere. No fall, no injury. No swelling or deformity to left ankle. Pt will not bare weight on left ankle.  Pt using crutches from previous injury

## 2023-11-07 NOTE — Discharge Instructions (Signed)
 Begin taking naproxen  as prescribed.  Begin taking hydrocodone  as prescribed as needed for pain not relieved with naproxen .  Wear Ace bandage for comfort and support.  Follow-up with primary doctor if not improving in the next few days.

## 2023-11-07 NOTE — ED Provider Notes (Signed)
  EMERGENCY DEPARTMENT AT [facility] Provider Note   CSN: [id number] Arrival date & time: 11/07/23  9684     Patient presents with: Ankle Pain (left)   Stephen Byrd is a 23 y.o. male.   Patient is a 23 year old male presenting with complaints of left ankle pain.  This began in the absence of any injury or trauma.  It began hurting yesterday and is difficult to bear weight.  He denies any fevers or chills.  No alleviating factors.       Prior to Admission medications   Medication Sig Start Date End Date Taking? Authorizing Provider  doxycycline  (VIBRAMYCIN ) 100 MG capsule Take 1 capsule (100 mg total) by mouth 2 (two) times daily for 14 days. 11/05/23 11/19/23  Theadore Ozell HERO, MD  HYDROcodone -acetaminophen  (NORCO/VICODIN) 5-325 MG tablet Take 1 tablet by mouth every 8 (eight) hours as needed for severe pain. 11/30/22   Margrette Taft BRAVO, MD  naproxen  (NAPROSYN ) 500 MG tablet Take 1 tablet (500 mg total) by mouth 2 (two) times daily. 11/30/22   Margrette Taft BRAVO, MD  naproxen  (NAPROSYN ) 500 MG tablet Take 1 tablet (500 mg total) by mouth 2 (two) times daily with a meal. 02/18/23   Beatty, Celeste A, PA-C    Allergies: Patient has no known allergies.    Review of Systems  All other systems reviewed and are negative.   Updated Vital Signs BP 127/77 (BP Location: Left Arm)   Pulse (!) 53   Temp 99 F (37.2 C) (Oral)   Resp 20   Ht 5' 7 (1.702 m)   Wt 72.6 kg   SpO2 100%   BMI 25.07 kg/m   Physical Exam Vitals and nursing note reviewed.  Constitutional:      Appearance: Normal appearance.  Pulmonary:     Effort: Pulmonary effort is normal.  Musculoskeletal:     Comments: The left ankle is grossly normal in appearance.  There is no swelling or effusion.  There is no redness or warmth.  He does have pain with range of motion and palpation of the lateral malleolus.  DP pulses are palpable and motor and sensation are intact throughout the entire foot.   Skin:    General: Skin is warm and dry.  Neurological:     Mental Status: He is alert and oriented to person, place, and time.     (all labs ordered are listed, but only abnormal results are displayed) Labs Reviewed - No data to display  EKG: None  Radiology: DG Ankle Complete Left Result Date: 11/07/2023 CLINICAL DATA:  Ankle pain EXAM: LEFT ANKLE COMPLETE - 3+ VIEW COMPARISON:  07/18/2021 FINDINGS: There is no evidence of fracture, dislocation, or joint effusion. There is no evidence of arthropathy or other focal bone abnormality. Soft tissues are unremarkable. IMPRESSION: Negative. Electronically Signed   By: Franky Crease M.D.   On: 11/07/2023 03:48     Procedures   Medications Ordered in the ED - No data to display                                  Medical Decision Making Amount and/or Complexity of Data Reviewed Radiology: ordered.   Patient presenting with left ankle pain that began in the absence of any injury or trauma.  His x-rays are negative and physical examination shows no effusion or warmth.  Suspect some sort of inflammatory process.  Will  treat with NSAIDs, pain medication, and follow-up as needed.     Final diagnoses:  None    ED Discharge Orders     None          Geroldine Berg, MD 11/07/23 [phone]

## 2023-11-08 ENCOUNTER — Ambulatory Visit (HOSPITAL_COMMUNITY): Payer: Self-pay

## 2024-02-11 IMAGING — DX DG ANKLE 2V *L*
2 series · 2 of 2 positions shown · non-contrast
Comparison: 06/23/2019 ankle series.

CLINICAL DATA: 20-year-old male with left ankle pain and swelling
for 2-3 days.

EXAM:
LEFT ANKLE - 2 VIEW

[ankle ap]
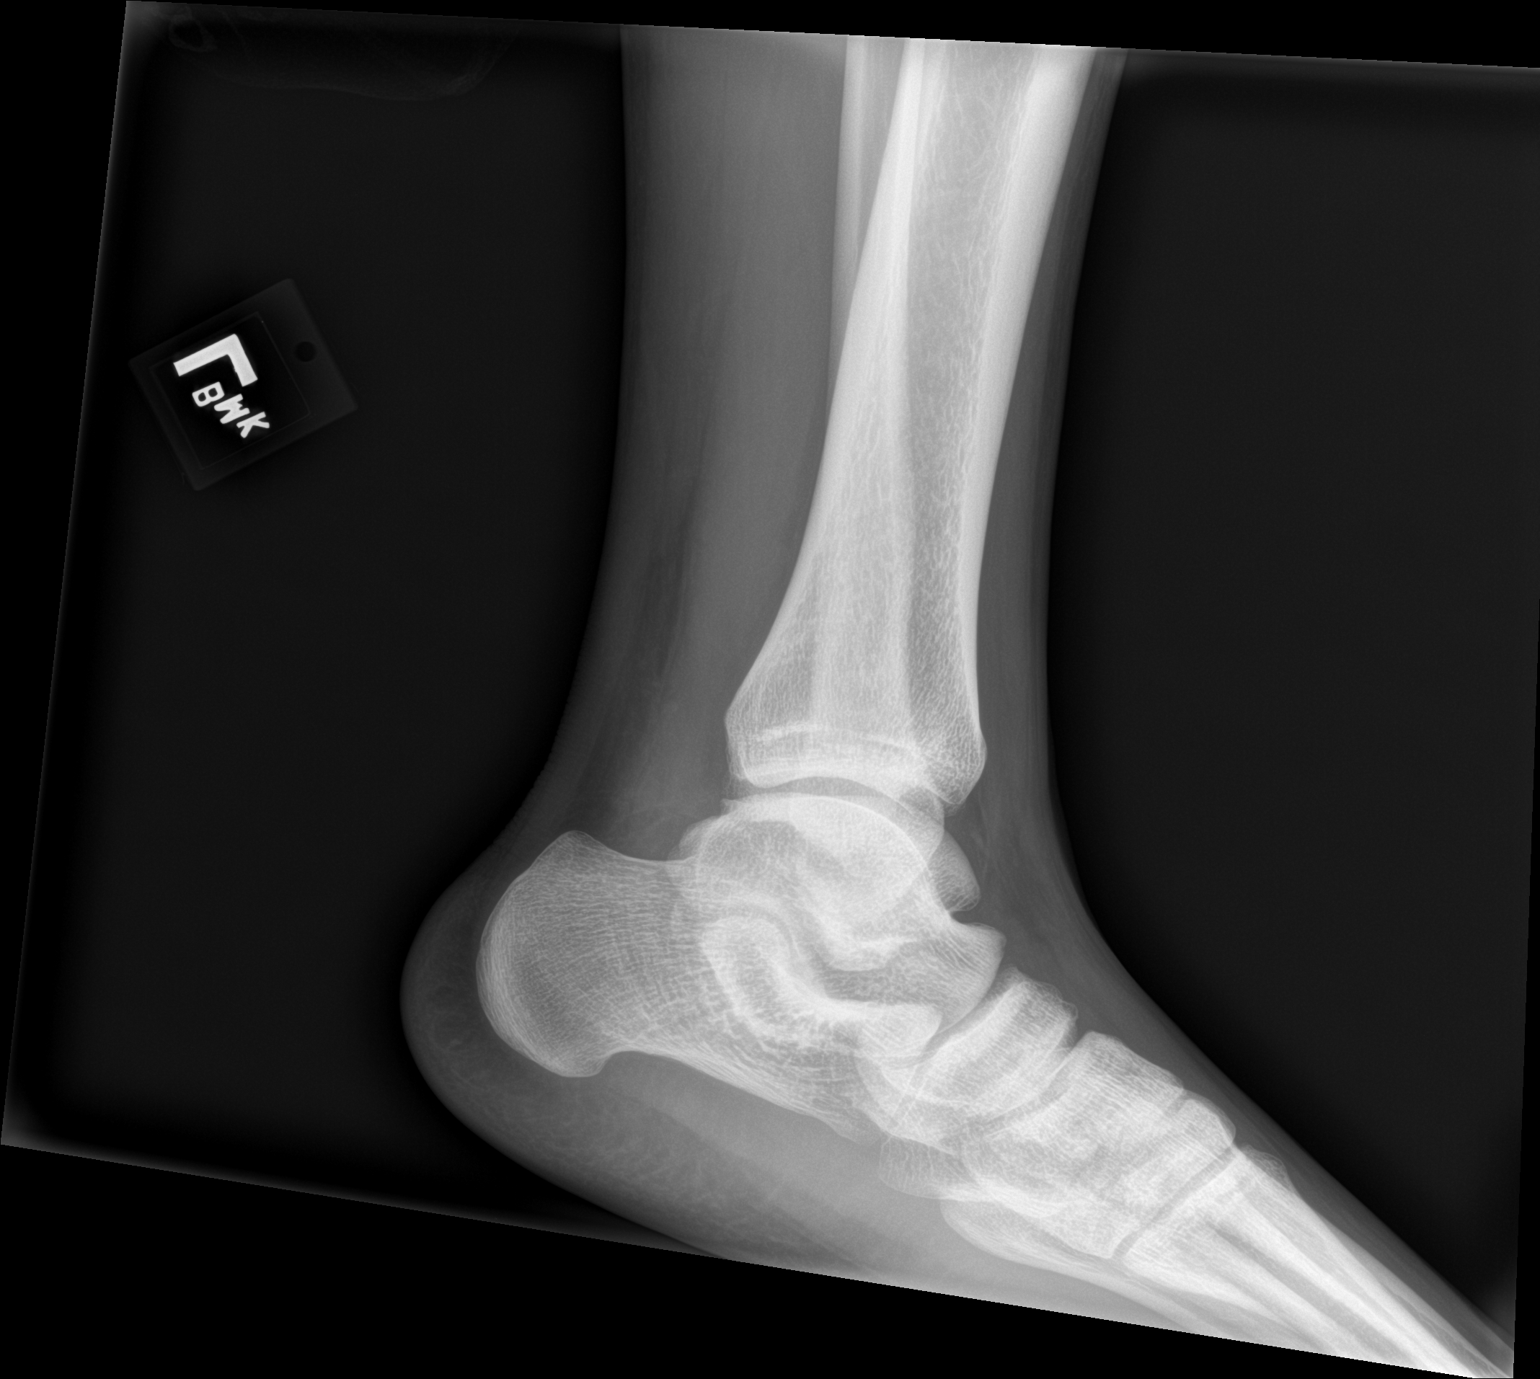

[ankle lat]
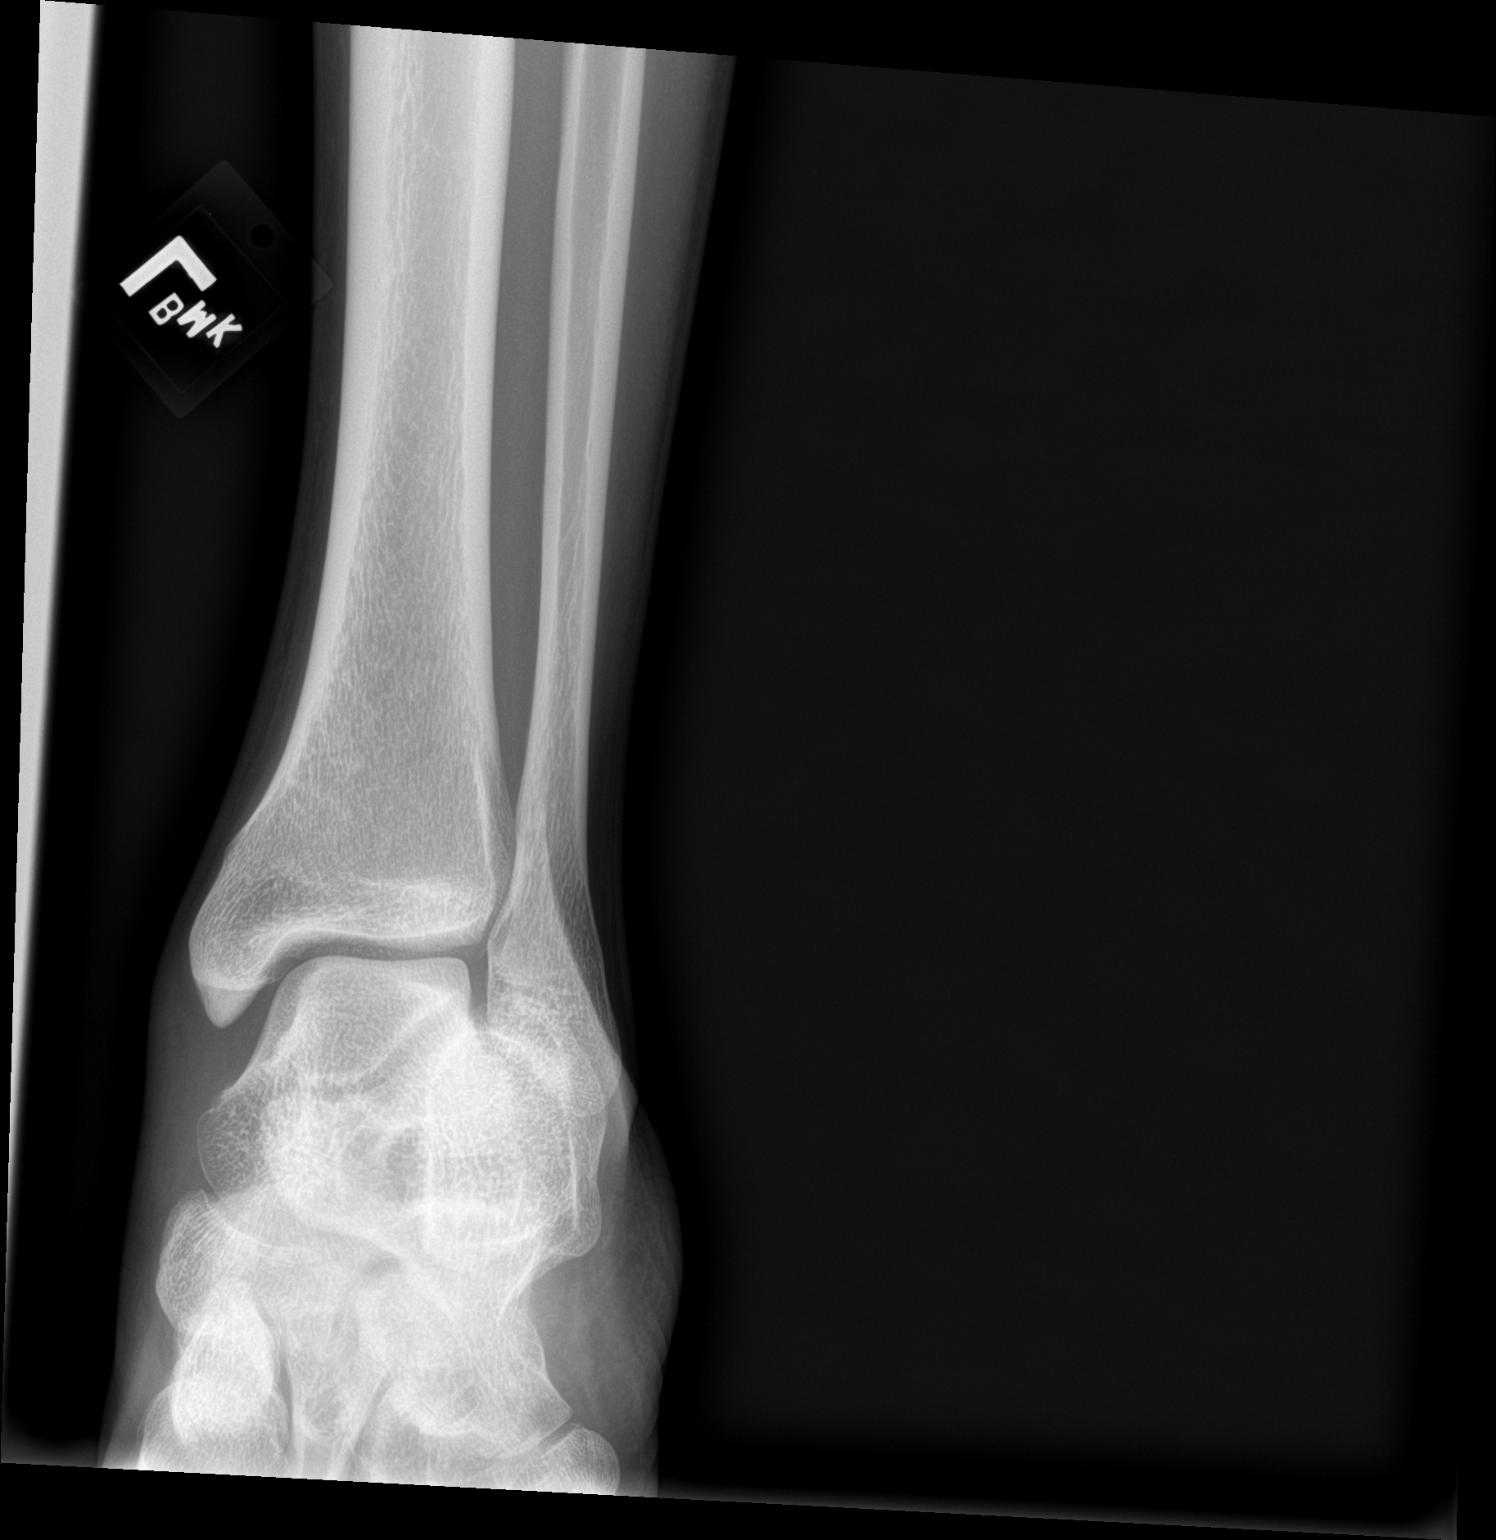

[2 of 2 positions shown; findings below may reference images not displayed]

FINDINGS: Bone mineralization is within normal limits. Mortise joint alignment
appears stable and normal. Talar dome intact. No joint effusion is
evident. No acute osseous abnormality identified. Superimposed pes
planus suspected. No discrete soft tissue abnormality.
IMPRESSION: Stable compared to 5857. No acute osseous abnormality identified.
Possible pes planus.

## 2024-04-15 ENCOUNTER — Emergency Department (HOSPITAL_COMMUNITY)
Admission: EM | Admit: 2024-04-15 | Discharge: 2024-04-15 | Disposition: A | Discharge status: Home / Self Care | Payer: MEDICAID | Attending: Emergency Medicine | Admitting: Emergency Medicine

## 2024-04-15 ENCOUNTER — Other Ambulatory Visit: Payer: Self-pay

## 2024-04-15 ENCOUNTER — Encounter (HOSPITAL_COMMUNITY): Payer: Self-pay

## 2024-04-15 DIAGNOSIS — N342 Other urethritis: Secondary | ICD-10-CM

## 2024-04-15 DIAGNOSIS — R369 Urethral discharge, unspecified: Secondary | ICD-10-CM | POA: Diagnosis present

## 2024-04-15 MED ORDER — CEFTRIAXONE SODIUM 500 MG IJ SOLR
500.0000 mg | Freq: Once | INTRAMUSCULAR | Status: AC
  Administered 2024-04-15: 22:00:00 500 mg via INTRAMUSCULAR
  Filled 2024-04-15: qty 500

## 2024-04-15 MED ORDER — DOXYCYCLINE HYCLATE 100 MG PO TABS
100.0000 mg | ORAL_TABLET | Freq: Once | ORAL | Status: AC
  Administered 2024-04-15: 22:00:00 100 mg via ORAL
  Filled 2024-04-15: qty 1

## 2024-04-15 MED ORDER — LIDOCAINE HCL (PF) 1 % IJ SOLN
2.1000 mL | Freq: Once | INTRAMUSCULAR | Status: AC
  Administered 2024-04-15: 22:00:00 2.1 mL
  Filled 2024-04-15: qty 5

## 2024-04-15 MED ORDER — DOXYCYCLINE HYCLATE 100 MG PO CAPS: 100.0000 mg | ORAL_CAPSULE | Freq: Two times a day (BID) | ORAL | 0 refills | Status: AC

## 2024-04-15 NOTE — Discharge Instructions (Signed)
 Follow-up with the health department next week to make sure you have been completely cured of this problem

## 2024-04-15 NOTE — ED Triage Notes (Signed)
 Pt states that he started having discharge from penis x couple days. States color is yellow and there is no pain, odor or bumps

## 2024-04-15 NOTE — ED Provider Notes (Signed)
°  [place] EMERGENCY DEPARTMENT AT [facility] Provider Note   CSN: [id number] Arrival date & time: 04/15/24  2046     Patient presents with: No chief complaint on file.   Stephen Byrd is a 23 y.o. male.   Patient complains of a discharge from his penis.  He states it is yellow   Dysuria Presenting symptoms: dysuria        Prior to Admission medications  Medication Sig Start Date End Date Taking? Authorizing Provider  doxycycline  (VIBRAMYCIN ) 100 MG capsule Take 1 capsule (100 mg total) by mouth 2 (two) times daily. One po bid x 7 days 04/15/24  Yes Cinsere Mizrahi, MD  HYDROcodone -acetaminophen  (NORCO/VICODIN) 5-325 MG tablet Take 1-2 tablets by mouth every 6 (six) hours as needed. 11/07/23   Geroldine Berg, MD  naproxen  (NAPROSYN ) 500 MG tablet Take 1 tablet (500 mg total) by mouth 2 (two) times daily with a meal. 11/07/23   Geroldine Berg, MD    Allergies: Patient has no known allergies.    Review of Systems  Genitourinary:  Positive for dysuria.    Updated Vital Signs BP 132/71 (BP Location: Right Arm)   Pulse 71   Temp 98.6 F (37 C)   Resp 18   Ht 5' 7 (1.702 m)   Wt 72.6 kg   SpO2 97%   BMI 25.06 kg/m   Physical Exam  (all labs ordered are listed, but only abnormal results are displayed) Labs Reviewed  GC/CHLAMYDIA PROBE AMP ([place]) NOT AT [facility]    EKG: None  Radiology: No results found.   Procedures   Medications Ordered in the ED  cefTRIAXone  (ROCEPHIN ) injection 500 mg (has no administration in time range)  doxycycline  (VIBRA -TABS) tablet 100 mg (has no administration in time range)                                    Medical Decision Making Risk Prescription drug management.  Patient with urethritis.  He is given Rocephin  and Doxy and told to follow-up with the health department for a test of cure     Final diagnoses:  Urethritis    ED Discharge Orders          Ordered    doxycycline  (VIBRAMYCIN ) 100 MG  capsule  2 times daily        04/15/24 2120               Suzette Pac, MD 04/15/24 2121

## 2024-04-17 LAB — GC/CHLAMYDIA PROBE AMP (~~LOC~~) NOT AT ARMC
Chlamydia: NEGATIVE
Comment: NEGATIVE
Comment: NORMAL
Neisseria Gonorrhea: POSITIVE — AB

## 2024-04-19 ENCOUNTER — Ambulatory Visit (HOSPITAL_COMMUNITY): Payer: Self-pay
